# Patient Record
Sex: Female | Born: 1995 | Race: Black or African American | Hispanic: No | Marital: Single | Smoking: Never smoker
Health system: Southern US, Community
[De-identification: ages and names within clinical notes are randomized; demographics above are authoritative.]

## PROBLEM LIST (undated history)

## (undated) DIAGNOSIS — Z7289 Other problems related to lifestyle: Secondary | ICD-10-CM

## (undated) DIAGNOSIS — D573 Sickle-cell trait: Secondary | ICD-10-CM

## (undated) DIAGNOSIS — F32A Depression, unspecified: Secondary | ICD-10-CM

## (undated) DIAGNOSIS — F329 Major depressive disorder, single episode, unspecified: Secondary | ICD-10-CM

## (undated) DIAGNOSIS — T7840XA Allergy, unspecified, initial encounter: Secondary | ICD-10-CM

## (undated) HISTORY — DX: Allergy, unspecified, initial encounter: T78.40XA

## (undated) HISTORY — DX: Sickle-cell trait: D57.3

---
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
## 2016-02-04 ENCOUNTER — Encounter: Payer: Self-pay | Admitting: Family Medicine

## 2016-02-04 ENCOUNTER — Ambulatory Visit (INDEPENDENT_AMBULATORY_CARE_PROVIDER_SITE_OTHER): Payer: Medicaid Other | Admitting: Family Medicine

## 2016-02-04 ENCOUNTER — Ambulatory Visit: Payer: Self-pay | Admitting: Family Medicine

## 2016-02-04 ENCOUNTER — Emergency Department (HOSPITAL_COMMUNITY)
Admission: EM | Admit: 2016-02-04 | Discharge: 2016-02-04 | Disposition: A | Discharge status: Home / Self Care | Payer: Medicaid Other | Attending: Emergency Medicine | Admitting: Emergency Medicine

## 2016-02-04 ENCOUNTER — Encounter (HOSPITAL_COMMUNITY): Payer: Self-pay | Admitting: *Deleted

## 2016-02-04 VITALS — BP 102/82 | HR 57 | Temp 98.3°F | Wt 170.0 lb

## 2016-02-04 DIAGNOSIS — F329 Major depressive disorder, single episode, unspecified: Secondary | ICD-10-CM | POA: Diagnosis not present

## 2016-02-04 DIAGNOSIS — Y990 Civilian activity done for income or pay: Secondary | ICD-10-CM | POA: Insufficient documentation

## 2016-02-04 DIAGNOSIS — Z7722 Contact with and (suspected) exposure to environmental tobacco smoke (acute) (chronic): Secondary | ICD-10-CM | POA: Insufficient documentation

## 2016-02-04 DIAGNOSIS — S0990XA Unspecified injury of head, initial encounter: Secondary | ICD-10-CM | POA: Diagnosis not present

## 2016-02-04 DIAGNOSIS — Y939 Activity, unspecified: Secondary | ICD-10-CM | POA: Diagnosis not present

## 2016-02-04 DIAGNOSIS — S060X0A Concussion without loss of consciousness, initial encounter: Secondary | ICD-10-CM | POA: Diagnosis not present

## 2016-02-04 DIAGNOSIS — Y929 Unspecified place or not applicable: Secondary | ICD-10-CM | POA: Diagnosis not present

## 2016-02-04 DIAGNOSIS — W228XXA Striking against or struck by other objects, initial encounter: Secondary | ICD-10-CM | POA: Insufficient documentation

## 2016-02-04 LAB — POCT URINE PREGNANCY: Preg Test, Ur: NEGATIVE

## 2016-02-04 NOTE — ED Notes (Signed)
Brought patient back to room; patient getting undressed and in gown at this time

## 2016-02-04 NOTE — Progress Notes (Signed)
Subjective: CC: fall at work HPI: Patient is a 20 y.o. female  presenting to clinic today for evaluation after a fall at work.  On Sunday evening after 6pm at work she fell on her head and left hip after slipping on pole. She hit the back side of her head on the tile wood. No LOC. Mild dizziness afterwards. She noted afterwards she had increased confusion. She notes she had difficulty with short term memory right afterwards. She had a headache that gradually came on on Sunday.  She was off work on Monday- she had a severe headache which kept her out of work on Tuesday.   She went back to work on Wednesday and had difficulty with focusing. She's gotten better at focusing (not back to baseline). She still has forgetfulness.   Her headache is in the front region, in the temples, and left parietal region. It feels like pressure and a really bad migraine or sinus infection. The headache is about the same as Monday when she woke up, 7/10. No blurred vision, aura, photophobia, sensitivity to sound. Looking at her phone makes the headache worse. No N/V.    She doesn't get headaches often. Not on blood thinners.   She notes her L hip is good, no bruising or pain.   Social History:  Non-smoker   ROS: All other systems reviewed and are negative.  Past Medical History Patient Active Problem List   Diagnosis Date Noted  . Head trauma 02/04/2016  . Seasonal allergies 10/11/2014  . Contraception management 10/11/2014  . Major depression, recurrent (HCC) 04/12/2014  . Mood disorder (HCC) 04/11/2014  . HYPERHIDROSIS 03/05/2009  . FRACTURE, ANKLE, RIGHT 03/05/2009  . Dermatophytosis of scalp and beard 03/11/2007    Medications- reviewed and updated Current Outpatient Prescriptions  Medication Sig Dispense Refill  . cetirizine (ZYRTEC) 10 MG tablet Take 1 tablet (10 mg total) by mouth daily. 30 tablet 11  . fluticasone (FLONASE) 50 MCG/ACT nasal spray Place 2 sprays into both nostrils daily.  16 g 6  . hydrOXYzine (ATARAX/VISTARIL) 25 MG tablet Take 1 tablet (25 mg total) by mouth 3 (three) times daily as needed for anxiety (anxiety). 30 tablet 0   No current facility-administered medications for this visit.    Objective: Office vital signs reviewed. BP 102/82 mmHg  Pulse 57  Temp(Src) 98.3 F (36.8 C) (Oral)  Wt 170 lb (77.111 kg)  LMP 01/23/2016   Physical Examination:  General: Awake, alert, well- nourished, NAD. No racoon eyes, no battle sign.  ENMT:  TMs intact, normal light reflex, no erythema, no bulging. No hemotympanum.  Nasal turbinates moist. MMM, Oropharynx clear without erythema or tonsillar exudate/hypertrophy.  Eyes: Conjunctiva non-injected. PERRL. Norma fundoscopic exam.  Cardio: RRR, no m/r/g noted.  Pulm: No increased WOB.  CTAB, without wheezes, rhonchi or crackles noted.  MSK: No tenderness over the left hip. No bruising. Tenderness over the parietal region on the left.  Neuro: A&O x4. Speech clear. EOMI, Uvula and tongue midline. Facial movements symmetric. 5/5 strength in the upper extremities and lower extremities bilaterally. Sensation intact bilaterally. Normal DTRs.   Assessment/Plan: Head trauma The patient is presenting after head trauma on Sunday. Since that time, she has had headache that has been stable, confusion, and intermittent amnesia.  Given these findings, her [place] head trauma score states that she should have a head CT to rule out intracranial process.  Fortunately, there are no neurologic deficits noted on exam. This is most likely concussive  syndrome. - Attempted to get a stat CT, however the earliest that this could be done was on Wednesday. - Advised the patient to go to the emergency room stating that she could get a CT performed sooner. - I provided the patient a note to stay out of work over the weekend. I advised her to follow-up on Monday to assess how her symptoms are doing. I discussed that we typically like complete  brain rest until the headache and other symptoms have completely resolved       Orders Placed This Encounter  Procedures  . CT HEAD WO CONTRAST    Standing Status: Future     Number of Occurrences:      Standing Expiration Date: 05/06/2017    Order Specific Question:  Reason for Exam (SYMPTOM  OR DIAGNOSIS REQUIRED)    Answer:  Head trauma Sunday, headache, anterograde amnesia    Order Specific Question:  Is the patient pregnant?    Answer:  No    Order Specific Question:  Preferred imaging location?    Answer:  [facility]  . POCT urine pregnancy    No orders of the defined types were placed in this encounter.    Joanna Puffrystal S. Dorsey PGY-3, [facility]

## 2016-02-04 NOTE — Assessment & Plan Note (Addendum)
The patient is presenting after head trauma on Sunday. Since that time, she has had headache that has been stable, confusion, and intermittent amnesia.  Given these findings, her [place] head trauma score states that she should have a head CT to rule out intracranial process.  Fortunately, there are no neurologic deficits noted on exam. This is most likely concussive syndrome. - Attempted to get a stat CT, however the earliest that this could be done was on Wednesday. - Advised the patient to go to the emergency room stating that she could get a CT performed sooner. - I provided the patient a note to stay out of work over the weekend. I advised her to follow-up on Monday to assess how her symptoms are doing. I discussed that we typically like complete brain rest until the headache and other symptoms have completely resolved

## 2016-02-04 NOTE — Patient Instructions (Signed)
I have ordered a head CT to evaluate to make sure that this is simply a concussion and not something more severe. You should continue to have cognitive rest, meaning that you should not work over the weekend, you should avoid watching TV or using a phone or tablet. INRs 1 from my team will call you when we have results of your head CT.  Concussion, Adult A concussion, or closed-head injury, is a brain injury caused by a direct blow to the head or by a quick and sudden movement (jolt) of the head or neck. Concussions are usually not life-threatening. Even so, the effects of a concussion can be serious. If you have had a concussion before, you are more likely to experience concussion-like symptoms after a direct blow to the head.  CAUSES  Direct blow to the head, such as from running into another player during a soccer game, being hit in a fight, or hitting your head on a hard surface.  A jolt of the head or neck that causes the brain to move back and forth inside the skull, such as in a car crash. SIGNS AND SYMPTOMS The signs of a concussion can be hard to notice. Early on, they may be missed by you, family members, and health care providers. You may look fine but act or feel differently. Symptoms are usually temporary, but they may last for days, weeks, or even longer. Some symptoms may appear right away while others may not show up for hours or days. Every head injury is different. Symptoms include:  Mild to moderate headaches that will not go away.  A feeling of pressure inside your head.  Having more trouble than usual:  Learning or remembering things you have heard.  Answering questions.  Paying attention or concentrating.  Organizing daily tasks.  Making decisions and solving problems.  Slowness in thinking, acting or reacting, speaking, or reading.  Getting lost or being easily confused.  Feeling tired all the time or lacking energy (fatigued).  Feeling drowsy.  Sleep  disturbances.  Sleeping more than usual.  Sleeping less than usual.  Trouble falling asleep.  Trouble sleeping (insomnia).  Loss of balance or feeling lightheaded or dizzy.  Nausea or vomiting.  Numbness or tingling.  Increased sensitivity to:  Sounds.  Lights.  Distractions.  Vision problems or eyes that tire easily.  Diminished sense of taste or smell.  Ringing in the ears.  Mood changes such as feeling sad or anxious.  Becoming easily irritated or angry for little or no reason.  Lack of motivation.  Seeing or hearing things other people do not see or hear (hallucinations). DIAGNOSIS Your health care provider can usually diagnose a concussion based on a description of your injury and symptoms. He or she will ask whether you passed out (lost consciousness) and whether you are having trouble remembering events that happened right before and during your injury. Your evaluation might include:  A brain scan to look for signs of injury to the brain. Even if the test shows no injury, you may still have a concussion.  Blood tests to be sure other problems are not present. TREATMENT  Concussions are usually treated in an emergency department, in urgent care, or at a clinic. You may need to stay in the hospital overnight for further treatment.  Tell your health care provider if you are taking any medicines, including prescription medicines, over-the-counter medicines, and natural remedies. Some medicines, such as blood thinners (anticoagulants) and aspirin, may increase the  chance of complications. Also tell your health care provider whether you have had alcohol or are taking illegal drugs. This information may affect treatment.  Your health care provider will send you home with important instructions to follow.  How fast you will recover from a concussion depends on many factors. These factors include how severe your concussion is, what part of your brain was injured,  your age, and how healthy you were before the concussion.  Most people with mild injuries recover fully. Recovery can take time. In general, recovery is slower in older persons. Also, persons who have had a concussion in the past or have other medical problems may find that it takes longer to recover from their current injury. HOME CARE INSTRUCTIONS General Instructions  Carefully follow the directions your health care provider gave you.  Only take over-the-counter or prescription medicines for pain, discomfort, or fever as directed by your health care provider.  Take only those medicines that your health care provider has approved.  Do not drink alcohol until your health care provider says you are well enough to do so. Alcohol and certain other drugs may slow your recovery and can put you at risk of further injury.  If it is harder than usual to remember things, write them down.  If you are easily distracted, try to do one thing at a time. For example, do not try to watch TV while fixing dinner.  Talk with family members or close friends when making important decisions.  Keep all follow-up appointments. Repeated evaluation of your symptoms is recommended for your recovery.  Watch your symptoms and tell others to do the same. Complications sometimes occur after a concussion. Older adults with a brain injury may have a higher risk of serious complications, such as a blood clot on the brain.  Tell your teachers, school nurse, school counselor, coach, athletic trainer, or work Production designer, theatre/television/film about your injury, symptoms, and restrictions. Tell them about what you can or cannot do. They should watch for:  Increased problems with attention or concentration.  Increased difficulty remembering or learning new information.  Increased time needed to complete tasks or assignments.  Increased irritability or decreased ability to cope with stress.  Increased symptoms.  Rest. Rest helps the brain to  heal. Make sure you:  Get plenty of sleep at night. Avoid staying up late at night.  Keep the same bedtime hours on weekends and weekdays.  Rest during the day. Take daytime naps or rest breaks when you feel tired.  Limit activities that require a lot of thought or concentration. These include:  Doing homework or job-related work.  Watching TV.  Working on the computer.  Avoid any situation where there is potential for another head injury (football, hockey, soccer, basketball, martial arts, downhill snow sports and horseback riding). Your condition will get worse every time you experience a concussion. You should avoid these activities until you are evaluated by the appropriate follow-up health care providers. Returning To Your Regular Activities You will need to return to your normal activities slowly, not all at once. You must give your body and brain enough time for recovery.  Do not return to sports or other athletic activities until your health care provider tells you it is safe to do so.  Ask your health care provider when you can drive, ride a bicycle, or operate heavy machinery. Your ability to react may be slower after a brain injury. Never do these activities if you are dizzy.  Ask  your health care provider about when you can return to work or school. Preventing Another Concussion It is very important to avoid another brain injury, especially before you have recovered. In rare cases, another injury can lead to permanent brain damage, brain swelling, or death. The risk of this is greatest during the first 7-10 days after a head injury. Avoid injuries by:  Wearing a seat belt when riding in a car.  Drinking alcohol only in moderation.  Wearing a helmet when biking, skiing, skateboarding, skating, or doing similar activities.  Avoiding activities that could lead to a second concussion, such as contact or recreational sports, until your health care provider says it is  okay.  Taking safety measures in your home.  Remove clutter and tripping hazards from floors and stairways.  Use grab bars in bathrooms and handrails by stairs.  Place non-slip mats on floors and in bathtubs.  Improve lighting in dim areas. SEEK MEDICAL CARE IF:  You have increased problems paying attention or concentrating.  You have increased difficulty remembering or learning new information.  You need more time to complete tasks or assignments than before.  You have increased irritability or decreased ability to cope with stress.  You have more symptoms than before. Seek medical care if you have any of the following symptoms for more than 2 weeks after your injury:  Lasting (chronic) headaches.  Dizziness or balance problems.  Nausea.  Vision problems.  Increased sensitivity to noise or light.  Depression or mood swings.  Anxiety or irritability.  Memory problems.  Difficulty concentrating or paying attention.  Sleep problems.  Feeling tired all the time. SEEK IMMEDIATE MEDICAL CARE IF:  You have severe or worsening headaches. These may be a sign of a blood clot in the brain.  You have weakness (even if only in one hand, leg, or part of the face).  You have numbness.  You have decreased coordination.  You vomit repeatedly.  You have increased sleepiness.  One pupil is larger than the other.  You have convulsions.  You have slurred speech.  You have increased confusion. This may be a sign of a blood clot in the brain.  You have increased restlessness, agitation, or irritability.  You are unable to recognize people or places.  You have neck pain.  It is difficult to wake you up.  You have unusual behavior changes.  You lose consciousness. MAKE SURE YOU:  Understand these instructions.  Will watch your condition.  Will get help right away if you are not doing well or get worse.   This information is not intended to replace advice  given to you by your health care provider. Make sure you discuss any questions you have with your health care provider.   Document Released: 09/23/2003 Document Revised: 07/24/2014 Document Reviewed: 01/23/2013 Elsevier Interactive Patient Education [employer]

## 2016-02-04 NOTE — ED Notes (Addendum)
Pt reports falling on Sunday at work and hit back of head, no loc. Having constant headache and memory loss since. Denies n/v. [facility] and sent here for ct scan.

## 2016-02-04 NOTE — Discharge Instructions (Signed)
Return to the ER immediately if your headache worsens, he developed vomiting, weakness, numbness, or any other new or concerning symptoms. Follow-up with your primary care doctor as scheduled.

## 2016-02-04 NOTE — ED Provider Notes (Addendum)
CSN: [id number]     Arrival date & time 02/04/16  1048 History   First MD Initiated Contact with Patient 02/04/16 1148     Chief Complaint  Patient presents with  . Fall  . Head Injury     (Consider location/radiation/quality/duration/timing/severity/associated sxs/prior Treatment) HPI  20 year old female presents with a headache since slipping and falling 5 days ago. She saw her primary care physician today and was sent to the ER for a stat head CT. Patient states that she slipped and fell on it the back of her head 5 days ago and did not lose consciousness. Later that evening developed a headache. Since then has had a constant, 7/10 headache. It is frontal and bitemporal. Has also been having memory issues, including forgetting things that coworkers talked about. Headache is not dramatically worsened. No nausea or vomiting. Some mild photophobia and some pain when looking closely at her phone. No weakness or numbness. Has not taken any medicines for the pain. No neck pain or stiffness.  Past Medical History  Diagnosis Date  . Allergy   . Sickle cell trait (HCC)   . Depression   . Self-mutilation    Past Surgical History  Procedure Laterality Date  . Ankle fracture surgery  2010    Bimalleolar fx repair, ORIF, fasciotiomy for compartment syndrome   Family History  Problem Relation Age of Onset  . Birth defects Brother     coarctation aorta  . Heart disease Brother   . Diabetes Maternal Grandmother   . Hypertension Maternal Grandmother   . Bipolar disorder Maternal Grandmother   . Cancer Maternal Grandmother     breast  . Hypertension Maternal Grandfather   . Kidney disease Maternal Grandfather   . Heart disease Maternal Grandfather    Social History  Substance Use Topics  . Smoking status: Passive Smoke Exposure - Never Smoker  . Smokeless tobacco: Never Used     Comment: mom smokes around her  . Alcohol Use: No   OB History    No data available     Review of  Systems  Eyes: Positive for photophobia. Negative for visual disturbance.  Gastrointestinal: Negative for nausea and vomiting.  Musculoskeletal: Negative for neck pain.  Neurological: Positive for dizziness and headaches. Negative for syncope, weakness and numbness.  All other systems reviewed and are negative.     Allergies  Apple; Carrot; Strawberry extract; Tomato; Banana; and Bee venom  Home Medications   Prior to Admission medications   Medication Sig Start Date End Date Taking? Authorizing Provider  cetirizine (ZYRTEC) 10 MG tablet Take 1 tablet (10 mg total) by mouth daily. 10/08/14   Latrelle DodrillBrittany J McIntyre, MD  fluticasone (FLONASE) 50 MCG/ACT nasal spray Place 2 sprays into both nostrils daily. 10/08/14   Latrelle DodrillBrittany J McIntyre, MD  hydrOXYzine (ATARAX/VISTARIL) 25 MG tablet Take 1 tablet (25 mg total) by mouth 3 (three) times daily as needed for anxiety (anxiety). 04/14/14   Thermon LeylandLaura A Davis, NP   BP 112/73 mmHg  Pulse 61  Temp(Src) 98.2 F (36.8 C) (Oral)  Resp 17  Ht 5\' 3"  (1.6 m)  Wt 170 lb (77.111 kg)  BMI 30.12 kg/m2  SpO2 99%  LMP 01/23/2016 Physical Exam  Constitutional: She is oriented to person, place, and time. She appears well-developed and well-nourished.  HENT:  Head: Normocephalic and atraumatic.  Right Ear: External ear normal.  Left Ear: External ear normal.  Nose: Nose normal.  No obvious trauma or swelling  Eyes: EOM are  normal. Pupils are equal, round, and reactive to light. Right eye exhibits no discharge. Left eye exhibits no discharge.  Neck: Normal range of motion. Neck supple.  Cardiovascular: Normal rate, regular rhythm and normal heart sounds.   Pulmonary/Chest: Effort normal and breath sounds normal.  Abdominal: Soft. There is no tenderness.  Neurological: She is alert and oriented to person, place, and time.  CN 3-12 grossly intact. 5/5 strength in all 4 extremities. Grossly normal sensation. Normal finger to nose  Skin: Skin is warm and dry.   Nursing note and vitals reviewed.   ED Course  Procedures (including critical care time) Labs Review Labs Reviewed - No data to display  Imaging Review No results found. I have personally reviewed and evaluated these images and lab results as part of my medical decision-making.   EKG Interpretation None      MDM   Final diagnoses:  Concussion, without loss of consciousness, initial encounter    Patient overall appears quite well. My suspicion for significant or any intracranial hemorrhage or significant abnormality is quite low. I think her symptoms are classic for a post concussive syndrome. She was sent here for CT scan. I discussed risks/benefits of CT including radiation as well as possible missed adverse event such as bleeding or skull fracture. When discussing risks and benefits, patient has decided she does not want the CT scan. I think this is reasonable, especially because the initial injury occurred over 5 days ago. She will follow-up with her PCP as scheduled in 3 days. I discussed strict return precautions.    Pricilla Loveless, MD 02/04/16 1214  Pricilla Loveless, MD 02/04/16 1215

## 2016-02-09 ENCOUNTER — Other Ambulatory Visit (HOSPITAL_COMMUNITY): Payer: Self-pay

## 2016-04-09 ENCOUNTER — Encounter (HOSPITAL_COMMUNITY): Payer: Self-pay | Admitting: *Deleted

## 2016-04-09 ENCOUNTER — Emergency Department (HOSPITAL_COMMUNITY)
Admission: EM | Admit: 2016-04-09 | Discharge: 2016-04-09 | Disposition: A | Discharge status: Home / Self Care | Payer: Medicaid Other | Attending: Emergency Medicine | Admitting: Emergency Medicine

## 2016-04-09 DIAGNOSIS — N898 Other specified noninflammatory disorders of vagina: Secondary | ICD-10-CM | POA: Diagnosis present

## 2016-04-09 DIAGNOSIS — B373 Candidiasis of vulva and vagina: Secondary | ICD-10-CM | POA: Diagnosis not present

## 2016-04-09 DIAGNOSIS — Z202 Contact with and (suspected) exposure to infections with a predominantly sexual mode of transmission: Secondary | ICD-10-CM | POA: Diagnosis not present

## 2016-04-09 DIAGNOSIS — B3731 Acute candidiasis of vulva and vagina: Secondary | ICD-10-CM

## 2016-04-09 DIAGNOSIS — Z7722 Contact with and (suspected) exposure to environmental tobacco smoke (acute) (chronic): Secondary | ICD-10-CM | POA: Insufficient documentation

## 2016-04-09 LAB — POC URINE PREG, ED: PREG TEST UR: NEGATIVE

## 2016-04-09 LAB — URINALYSIS, ROUTINE W REFLEX MICROSCOPIC
Bilirubin Urine: NEGATIVE
Glucose, UA: NEGATIVE mg/dL
Hgb urine dipstick: NEGATIVE
KETONES UR: NEGATIVE mg/dL
LEUKOCYTES UA: NEGATIVE
NITRITE: NEGATIVE
PROTEIN: NEGATIVE mg/dL
Specific Gravity, Urine: 1.01 (ref 1.005–1.030)
pH: 7 (ref 5.0–8.0)

## 2016-04-09 LAB — WET PREP, GENITAL
CLUE CELLS WET PREP: NONE SEEN
Sperm: NONE SEEN
TRICH WET PREP: NONE SEEN

## 2016-04-09 MED ORDER — AZITHROMYCIN 250 MG PO TABS
1000.0000 mg | ORAL_TABLET | Freq: Once | ORAL | Status: AC
  Administered 2016-04-09: 1000 mg via ORAL
  Filled 2016-04-09: qty 4

## 2016-04-09 MED ORDER — METRONIDAZOLE 500 MG PO TABS: 500.0000 mg | ORAL_TABLET | Freq: Three times a day (TID) | ORAL | 0 refills | Status: DC

## 2016-04-09 MED ORDER — METRONIDAZOLE 500 MG PO TABS
500.0000 mg | ORAL_TABLET | Freq: Once | ORAL | Status: AC
  Administered 2016-04-09: 500 mg via ORAL
  Filled 2016-04-09: qty 1

## 2016-04-09 MED ORDER — CEFTRIAXONE SODIUM 250 MG IJ SOLR
250.0000 mg | Freq: Once | INTRAMUSCULAR | Status: AC
  Administered 2016-04-09: 250 mg via INTRAMUSCULAR
  Filled 2016-04-09: qty 250

## 2016-04-09 NOTE — Discharge Instructions (Signed)
Your weight prep of vaginal swab shows that you do have a vaginal yeast infection.  You've also been treated for gonorrhea and chlamydia as a preventive cultures have been taken, and you will be notified if positive, you have been given instructions on safe sex.  Please try to practice this in the future

## 2016-04-09 NOTE — ED Provider Notes (Signed)
[facility]-EMERGENCY DEPT Provider Note   CSN: [id number] Arrival date & time: 04/09/16  1841     History   Chief Complaint Chief Complaint  Patient presents with  . Vaginal Discharge  . SEXUALLY TRANSMITTED DISEASE    HPI Jo [name] is a 20 y.o. female.  This is 20 year old female who is sexually active without protection reports that she's had vaginal discharge since Wednesday.  She tried using Monistat without any relief.  She felt that actually made the irritation worse.  She does have a history of one yeast infection in the past.  No history of STD or pregnancy      Past Medical History:  Diagnosis Date  . Allergy   . Depression   . Self-mutilation   . Sickle cell trait [facility])     Patient Active Problem List   Diagnosis Date Noted  . Head trauma 02/04/2016  . Seasonal allergies 10/11/2014  . Contraception management 10/11/2014  . Major depression, recurrent (HCC) 04/12/2014  . Mood disorder (HCC) 04/11/2014  . HYPERHIDROSIS 03/05/2009  . FRACTURE, ANKLE, RIGHT 03/05/2009  . Dermatophytosis of scalp and beard 03/11/2007    Past Surgical History:  Procedure Laterality Date  . ANKLE FRACTURE SURGERY  2010   Bimalleolar fx repair, ORIF, fasciotiomy for compartment syndrome    OB History    No data available       Home Medications    Prior to Admission medications   Medication Sig Start Date End Date Taking? Authorizing Provider  cetirizine (ZYRTEC) 10 MG tablet Take 1 tablet (10 mg total) by mouth daily. 10/08/14   Latrelle Dodrill, MD  fluticasone (FLONASE) 50 MCG/ACT nasal spray Place 2 sprays into both nostrils daily. 10/08/14   Latrelle Dodrill, MD  hydrOXYzine (ATARAX/VISTARIL) 25 MG tablet Take 1 tablet (25 mg total) by mouth 3 (three) times daily as needed for anxiety (anxiety). 04/14/14   Thermon Leyland, NP  metroNIDAZOLE (FLAGYL) 500 MG tablet Take 1 tablet (500 mg total) by mouth 3 (three) times daily. 04/09/16   Earley Favor, NP    Family  History Family History  Problem Relation Age of Onset  . Birth defects Brother     coarctation aorta  . Heart disease Brother   . Diabetes Maternal Grandmother   . Hypertension Maternal Grandmother   . Bipolar disorder Maternal Grandmother   . Cancer Maternal Grandmother     breast  . Hypertension Maternal Grandfather   . Kidney disease Maternal Grandfather   . Heart disease Maternal Grandfather     Social History Social History  Substance Use Topics  . Smoking status: Passive Smoke Exposure - Never Smoker  . Smokeless tobacco: Never Used     Comment: mom smokes around her  . Alcohol use No     Allergies   Apple; Carrot [daucus carota]; Strawberry extract; Tomato; Banana; and Bee venom   Review of Systems Review of Systems  Genitourinary: Positive for vaginal discharge and vaginal pain. Negative for dysuria.  All other systems reviewed and are negative.    Physical Exam Updated Vital Signs BP 122/79   Pulse 64   Temp 98.5 F (36.9 C) (Oral)   Resp 16   LMP 03/23/2016   SpO2 100%   Physical Exam  Constitutional: She appears well-developed and well-nourished. No distress.  Eyes: Pupils are equal, round, and reactive to light.  Neck: Normal range of motion.  Cardiovascular: Normal rate.   Pulmonary/Chest: Effort normal.  Abdominal: Soft. Bowel sounds  are normal. She exhibits no distension. There is no tenderness.  Genitourinary: Pelvic exam was performed with patient supine. Cervix exhibits discharge. Right adnexum displays no tenderness. Left adnexum displays no tenderness. Vaginal discharge found.  Genitourinary Comments: Strawberry cervix  Nursing note and vitals reviewed.    ED Treatments / Results  Labs (all labs ordered are listed, but only abnormal results are displayed) Labs Reviewed  WET PREP, GENITAL - Abnormal; Notable for the following:       Result Value   Yeast Wet Prep HPF POC PRESENT (*)    WBC, Wet Prep HPF POC MANY (*)    All other  components within normal limits  URINALYSIS, ROUTINE W REFLEX MICROSCOPIC (NOT AT [facility]) - Abnormal; Notable for the following:    APPearance CLOUDY (*)    All other components within normal limits  HIV ANTIBODY (ROUTINE TESTING)  RPR  POC URINE PREG, ED  GC/CHLAMYDIA PROBE AMP ([place]) NOT AT [facility]    EKG  EKG Interpretation None       Radiology No results found.  Procedures Procedures (including critical care time)  Medications Ordered in ED Medications  metroNIDAZOLE (FLAGYL) tablet 500 mg (not administered)  azithromycin (ZITHROMAX) tablet 1,000 mg (not administered)  cefTRIAXone (ROCEPHIN) injection 250 mg (not administered)     Initial Impression / Assessment and Plan / ED Course  I have reviewed the triage vital signs and the nursing notes.  Pertinent labs & imaging results that were available during my care of the patient were reviewed by me and considered in my medical decision making (see chart for details).  Clinical Course       Final Clinical Impressions(s) / ED Diagnoses   Final diagnoses:  Yeast vaginitis  STD exposure    New Prescriptions New Prescriptions   METRONIDAZOLE (FLAGYL) 500 MG TABLET    Take 1 tablet (500 mg total) by mouth 3 (three) times daily.     Earley FavorGail Alayzha An, NP 04/09/16 2121    Lorre NickAnthony Allen, MD 04/17/16 [phone]

## 2016-04-09 NOTE — ED Triage Notes (Signed)
Pt reports vaginal discharge and itching, no relief with monistat and wants to be checked for std. Denies urinary symptoms.

## 2016-04-10 LAB — GC/CHLAMYDIA PROBE AMP (~~LOC~~) NOT AT ARMC
Chlamydia: NEGATIVE
Neisseria Gonorrhea: NEGATIVE

## 2016-04-10 LAB — HIV ANTIBODY (ROUTINE TESTING W REFLEX): HIV Screen 4th Generation wRfx: NONREACTIVE

## 2016-04-10 LAB — RPR: RPR Ser Ql: NONREACTIVE

## 2016-07-05 ENCOUNTER — Ambulatory Visit: Payer: Medicaid Other | Admitting: Student

## 2016-09-27 ENCOUNTER — Ambulatory Visit: Payer: Medicaid Other | Admitting: Student

## 2016-10-04 ENCOUNTER — Ambulatory Visit (INDEPENDENT_AMBULATORY_CARE_PROVIDER_SITE_OTHER): Payer: Medicaid Other | Admitting: Student

## 2016-10-04 ENCOUNTER — Encounter: Payer: Self-pay | Admitting: Student

## 2016-10-04 VITALS — BP 110/80 | HR 71 | Temp 98.2°F | Wt 183.0 lb

## 2016-10-04 DIAGNOSIS — Z3046 Encounter for surveillance of implantable subdermal contraceptive: Secondary | ICD-10-CM

## 2016-10-04 DIAGNOSIS — Z30019 Encounter for initial prescription of contraceptives, unspecified: Secondary | ICD-10-CM

## 2016-10-04 DIAGNOSIS — Z7251 High risk heterosexual behavior: Secondary | ICD-10-CM | POA: Diagnosis not present

## 2016-10-04 DIAGNOSIS — F39 Unspecified mood [affective] disorder: Secondary | ICD-10-CM | POA: Diagnosis not present

## 2016-10-04 LAB — POCT URINE PREGNANCY: Preg Test, Ur: NEGATIVE

## 2016-10-04 NOTE — Progress Notes (Signed)
   Subjective:    Patient ID: Jo [name], female    DOB: 07/11/1996, 20 y.o.   MRN: [id number]   CC: discuss birth control and depression  HPI: 21 y/o F who presents to discuss birth control and depression  Birth control - Patient's last menstrual period was 09/18/2016. - she was last sexually active 3 months ago, she used a condom - she desires contraception to help regulate her hormones.  - she feels that she has low moods surrounding her periods and wishes to start something to help regulate her moods  Depression - she feels her mood is generally good except surrounding her menses when she develops low moods and eats less - she denies SI/HI during that time or otherwise - she has been seeing a counselor  - PHQ9- 9, GAD7- 4  Smoking status reviewed  Review of Systems  Per HPI, else denies recent illness, fever, vaginal irritation, discharge   Objective:  BP 110/80   Pulse 71   Temp 98.2 F (36.8 C) (Oral)   Wt 183 lb (83 kg)   LMP 09/18/2016   SpO2 99%   BMI 32.42 kg/m  Vitals and nursing note reviewed  General: NAD Cardiac: RRR,  Respiratory: CTAB, normal effortdd Extremities: no edema or cyanosis. WWP. Skin: warm and dry, no rashes noted Neuro: alert and oriented, no focal deficits   Assessment & Plan:    Contraception management Upreg negative. Nexplanon placed today ( see procedure note) - follow up in 1 month to assess tolerance - RPR, HIV collected today  Mood disorder PHQ9 9, GAD7- 4, no red flags on interview no SI/HI - will follow with [facility]     Viktorya Arguijo A. Kennon RoundsHaney MD, MS Family Medicine Resident PGY-3 Pager [phone]

## 2016-10-04 NOTE — Assessment & Plan Note (Signed)
PHQ9 9, GAD7- 4, no red flags on interview no SI/HI - [facility]

## 2016-10-04 NOTE — Progress Notes (Signed)
PROCEDURE NOTE: Nexplanon insertion Patient given informed consent, signed copy in the chart.  Appropriate time out taken  Pregnancy test was negative.  The patient's  left arm was prepped and draped in the usual sterile fashion.. The ruler used to measure and mark the insertion area 6 cm from medial epicondyle of the elbow. Local anaesthesia obtained using 1.5 cc of 1% lidocaine with epinephrine. Nexplanon was inserted per manufacturer's directions. Less than 1 cc blood loss. The insertion site covered with antibiotic ointment and a pressure bandage to minimize bruising. There were no complications and the patient tolerated the procedure well.  Device information was given in handout form. Patient is informed the removal date will be in three years and package insert card filled out and given to her.

## 2016-10-04 NOTE — Patient Instructions (Signed)
Follow up in one month with PCP Follow up with behavioral health If you have questions or concerns, call the office at [phone]

## 2016-10-04 NOTE — Assessment & Plan Note (Addendum)
Upreg negative. Nexplanon placed today ( see procedure note) - follow up in 1 month to assess tolerance - RPR, HIV collected today

## 2016-10-05 ENCOUNTER — Telehealth: Payer: Self-pay | Admitting: Student

## 2016-10-05 LAB — RPR: RPR Ser Ql: NONREACTIVE

## 2016-10-05 LAB — HIV ANTIBODY (ROUTINE TESTING W REFLEX): HIV Screen 4th Generation wRfx: NONREACTIVE

## 2016-10-05 MED ORDER — ETONOGESTREL 68 MG ~~LOC~~ IMPL
68.0000 mg | DRUG_IMPLANT | Freq: Once | SUBCUTANEOUS | Status: AC
  Administered 2016-10-04: 68 mg via SUBCUTANEOUS

## 2016-10-05 NOTE — Telephone Encounter (Signed)
Please call the patient and inform her that her RPR and HIV were negative

## 2016-10-05 NOTE — Telephone Encounter (Signed)
Reported negative labs to patient.

## 2016-10-05 NOTE — Addendum Note (Signed)
Addended by: Jone BasemanFLEEGER, Tasman Zapata D on: 10/05/2016 10:52 AM   Modules accepted: Orders

## 2016-11-06 ENCOUNTER — Ambulatory Visit: Payer: Self-pay | Admitting: Student

## 2019-04-03 ENCOUNTER — Other Ambulatory Visit (HOSPITAL_COMMUNITY)
Admission: RE | Admit: 2019-04-03 | Discharge: 2019-04-03 | Disposition: A | Discharge status: Home / Self Care | Payer: Medicaid Other | Source: Ambulatory Visit | Attending: Family Medicine | Admitting: Family Medicine

## 2019-04-03 ENCOUNTER — Ambulatory Visit (INDEPENDENT_AMBULATORY_CARE_PROVIDER_SITE_OTHER): Payer: Medicaid Other | Admitting: Family Medicine

## 2019-04-03 ENCOUNTER — Other Ambulatory Visit: Payer: Self-pay

## 2019-04-03 VITALS — BP 124/65 | HR 97 | Temp 98.8°F | Wt 206.0 lb

## 2019-04-03 DIAGNOSIS — Z01419 Encounter for gynecological examination (general) (routine) without abnormal findings: Secondary | ICD-10-CM

## 2019-04-03 DIAGNOSIS — Z124 Encounter for screening for malignant neoplasm of cervix: Secondary | ICD-10-CM | POA: Insufficient documentation

## 2019-04-03 NOTE — Assessment & Plan Note (Signed)
Will communicate results with patient via my chart or via phone call.  If normal, patient to follow-up in 3 years for repeat testing.  Patient will return next year to have Nexplanon removed and replaced.

## 2019-04-03 NOTE — Patient Instructions (Addendum)
Dear Jo [name],   It was good to see you! Thank you for taking your time to come in to be seen. Today, we discussed the following:   Pap Smear   You did so great today!! I know its uncomfortable!   We will send you results when they return.   Sign up for MyChart so that we can communicate via the app.   See you next year for the nexplanon.   Also, you will have some light bleeding today. It should stop by tomorrow. Call us if you have any concerns.   Be well,   Genia Hotterachel Kim, M.D   [facility] [phone]  *Sign up for MyChart for instant access to your health profile, labs, orders, upcoming appointments or to contact your provider with questions*  ===================================================================================  Pap Test Why am I having this test? A Pap test, also called a Pap smear, is a screening test to check for signs of:  Cancer of the vagina, cervix, and uterus. The cervix is the lower part of the uterus that opens into the vagina.  Infection.  Changes that may be a sign that cancer is developing (precancerous changes). Women need this test on a regular basis. In general, you should have a Pap test every 3 years until you reach menopause or age 23. Women aged 30-60 may choose to have their Pap test done at the same time as an HPV (human papillomavirus) test every 5 years (instead of every 3 years). Your health care provider may recommend having Pap tests more or less often depending on your medical conditions and past Pap test results. What kind of sample is taken?  Your health care provider will collect a sample of cells from the surface of your cervix. This will be done using a small cotton swab, plastic spatula, or brush. This sample is often collected during a pelvic exam, when you are lying on your back on an exam table with feet in footrests (stirrups). In some cases, fluids (secretions) from the cervix or vagina may also be  collected. How do I prepare for this test?  Be aware of where you are in your menstrual cycle. If you are menstruating on the day of the test, you may be asked to reschedule.  You may need to reschedule if you have a known vaginal infection on the day of the test.  Follow instructions from your health care provider about: ? Changing or stopping your regular medicines. Some medicines can cause abnormal test results, such as digitalis and tetracycline. ? Avoiding douching or taking a bath the day before or the day of the test. Tell a health care provider about:  Any allergies you have.  All medicines you are taking, including vitamins, herbs, eye drops, creams, and over-the-counter medicines.  Any blood disorders you have.  Any surgeries you have had.  Any medical conditions you have.  Whether you are pregnant or may be pregnant. How are the results reported? Your test results will be reported as either abnormal or normal. A false-positive result can occur. A false positive is incorrect because it means that a condition is present when it is not. A false-negative result can occur. A false negative is incorrect because it means that a condition is not present when it is. What do the results mean? A normal test result means that you do not have signs of cancer of the vagina, cervix, or uterus. An abnormal result may mean that you have:  Cancer. A Pap test by itself is not enough to diagnose cancer. You will have more tests done in this case.  Precancerous changes in your vagina, cervix, or uterus.  Inflammation of the cervix.  An STD (sexually transmitted disease).  A fungal infection.  A parasite infection. Talk with your health care provider about what your results mean. Questions to ask your health care provider Ask your health care provider, or the department that is doing the test:  When will my results be ready?  How will I get my results?  What are my treatment  options?  What other tests do I need?  What are my next steps? Summary  In general, women should have a Pap test every 3 years until they reach menopause or age 3.  Your health care provider will collect a sample of cells from the surface of your cervix. This will be done using a small cotton swab, plastic spatula, or brush.  In some cases, fluids (secretions) from the cervix or vagina may also be collected. This information is not intended to replace advice given to you by your health care provider. Make sure you discuss any questions you have with your health care provider. Document Released: 09/23/2002 Document Revised: 03/12/2017 Document Reviewed: 03/12/2017 Elsevier Patient Education  2020 [employer].

## 2019-04-03 NOTE — Progress Notes (Signed)
      Subjective  CC: Health Maintenance  [id number] U Jo [name] is a 23 y.o. female who presents today with the following problems: Pap Smear  Patient has never had previous Pap smear.  Pertinent PMFSHx: Nexplanon for birth control.  Maternal grandmother with history of breast cancer.  No other vaginal, vulvar, cervical, ovarian cancer noted. ROS: Pertinent ROS included in HPI.    Objective  Physical Exam:  BP 124/65   Pulse 97   Temp 98.8 F (37.1 C) (Oral)   Wt 206 lb (93.4 kg)   LMP 03/20/2019   SpO2 97%   BMI 36.49 kg/m  GU: External vulva and vagina nonerythematous, without any obvious lesions or rash.  Spinnbarkeit discharge appreciated. Normal ruggae of vaginal walls.  Cervix is non erythematous and non-friable and sits to patient's left and anteroverted.  There is no cervical motion tenderness, masses or gross abnormalities appreciated during bimanual exam.      Assessment & Plan    Problem List Items Addressed This Visit      Unprioritized   Screening for cervical cancer - Primary    Will communicate results with patient via my chart or via phone call.  If normal, patient to follow-up in 3 years for repeat testing.  Patient will return next year to have Nexplanon removed and replaced.      Relevant Orders   Cytology - PAP([facility])     Wilber Oliphant, M.D.  PGY-2  Family Medicine  [phone] 04/03/2019 11:50 AM

## 2019-04-08 LAB — CYTOLOGY - PAP
Chlamydia: POSITIVE — AB
Diagnosis: NEGATIVE
Molecular Disclaimer: NEGATIVE
Molecular Disclaimer: NEGATIVE
Neisseria Gonorrhea: NEGATIVE
Trichomonas: NEGATIVE

## 2019-05-08 ENCOUNTER — Other Ambulatory Visit: Payer: Self-pay

## 2019-05-08 ENCOUNTER — Ambulatory Visit (INDEPENDENT_AMBULATORY_CARE_PROVIDER_SITE_OTHER): Payer: Medicaid Other | Admitting: *Deleted

## 2019-05-08 ENCOUNTER — Telehealth: Payer: Self-pay | Admitting: Family Medicine

## 2019-05-08 DIAGNOSIS — A749 Chlamydial infection, unspecified: Secondary | ICD-10-CM | POA: Diagnosis present

## 2019-05-08 MED ORDER — AZITHROMYCIN 500 MG PO TABS
1000.0000 mg | ORAL_TABLET | Freq: Once | ORAL | Status: AC
  Administered 2019-05-08: 17:00:00 1000 mg via ORAL

## 2019-05-08 NOTE — Telephone Encounter (Signed)
Notified of positive chlamydia She will come for treatment tomorrow (nurse visit) at 10:00. She "knows who the likely exposure is from and is advised of need for allof her current sexual partners to be treated. Will also give her handout when she comes tomorrow NURSE VISIT: please treat with one gram po azithromycin  Jo [name]

## 2019-05-08 NOTE — Progress Notes (Signed)
Patient in nurse clinic today for STD treatment of Chlamydia.    Patient advised to abstain from sex for 7-10 days after treatment or when partner has been tested/treated.    Azithromycin 1 GM PO x 1 given per Dr. Verlon Au orders.  Patient observed 15 minutes in office.  No reaction noted.   Patient to follow up in 2-3 months for re-screening.    Provided condoms and advised to use with all sexual activity. Patient verbalized understanding.   STD report form fax completed and faxed to [facility] Department at [phone] (STD department).     Christen Bame, CMA

## 2019-05-09 ENCOUNTER — Ambulatory Visit: Payer: Medicaid Other

## 2019-05-11 ENCOUNTER — Telehealth: Payer: Self-pay | Admitting: Family Medicine

## 2019-05-11 NOTE — Telephone Encounter (Signed)
[facility] Telemedicine Visit  Patient consented to have virtual visit. Method of visit: Telephone  Encounter participants: Patient: Jo [name] - located at home Provider: Matilde Haymaker - located at [facility] Others (if applicable): none  Chief Complaint: recent test chlamydia  HPI:  Chlamydia Jo [name] was distressed about her recent results of a chlamydia infection and wanted to discuss them with a healthcare provider.  She recently learned that she was chlamydia positive and that she likely contracted this chlamydia from her significant other.  Her significant [facility] and reported a negative chlamydia test.  Jo [name] wanted to know how she might have a positive chlamydia test if her significant other had a negative chlamydia test.  She reported that she was given azithromycin as treatment for infection.  She wanted to know if anything further was necessary.  She also reports strong abdominal cramping that feels similar to her monthly menstruations though more intense.  This cramping has been going on for several months and preceded her diagnosis of chlamydia infection.  She assumed that this increased abdominal cramping may be related to her Nexplanon wearing off.  Her Nexplanon was placed about 2 years ago.  She wanted to know what to look out for and when she should be assessed for anything dangerous as regards her abdominal cramping.  ROS: per HPI  Pertinent PMHx: Recent diagnosis of chlamydia  Exam:  Respiratory: Breathing comfortably on room air.  Able to complete long sentences without effort  Assessment/Plan:  Chlamydia She was informed that the diagnosis of chlamydia is serious and can lead to dangerous long-term effects if not treated.  She is informed that her positive test result is most likely true and she should be treated to avoid long-term complications including scarring of the  fallopian tube.  She was also encouraged to abstain from sexual intercourse with her significant other the next 7 to 10 days and encouraged treatment of her significant other.  She was told that she was treated correctly needed no further investigation at this time.  She was informed that her abdominal cramping is not necessarily related to her Nexplanon or to her chlamydial infection.  She was encouraged to be assessed by clinician if her abdominal cramping worsens in severity, leads to nausea/vomiting where she experiences any new vaginal discharge.  She acknowledged understanding and her that she would be assessed if her pain worsened or persisted.  Time spent during visit with patient: 15 minutes

## 2019-09-30 ENCOUNTER — Other Ambulatory Visit: Payer: Self-pay

## 2019-09-30 ENCOUNTER — Ambulatory Visit: Payer: Medicaid Other | Admitting: Family Medicine

## 2019-09-30 ENCOUNTER — Other Ambulatory Visit (HOSPITAL_COMMUNITY)
Admission: RE | Admit: 2019-09-30 | Discharge: 2019-09-30 | Disposition: A | Discharge status: Home / Self Care | Payer: Medicaid Other | Source: Ambulatory Visit | Attending: Family Medicine | Admitting: Family Medicine

## 2019-09-30 ENCOUNTER — Ambulatory Visit (INDEPENDENT_AMBULATORY_CARE_PROVIDER_SITE_OTHER): Payer: Medicaid Other | Admitting: Family Medicine

## 2019-09-30 VITALS — BP 122/75 | HR 76 | Wt 209.2 lb

## 2019-09-30 DIAGNOSIS — Z113 Encounter for screening for infections with a predominantly sexual mode of transmission: Secondary | ICD-10-CM | POA: Insufficient documentation

## 2019-09-30 DIAGNOSIS — R4589 Other symptoms and signs involving emotional state: Secondary | ICD-10-CM | POA: Diagnosis not present

## 2019-09-30 DIAGNOSIS — B9689 Other specified bacterial agents as the cause of diseases classified elsewhere: Secondary | ICD-10-CM | POA: Insufficient documentation

## 2019-09-30 DIAGNOSIS — N760 Acute vaginitis: Secondary | ICD-10-CM

## 2019-09-30 LAB — POCT WET PREP (WET MOUNT)
Clue Cells Wet Prep Whiff POC: POSITIVE
Trichomonas Wet Prep HPF POC: ABSENT

## 2019-09-30 MED ORDER — METRONIDAZOLE 500 MG PO TABS: 500.0000 mg | ORAL_TABLET | Freq: Two times a day (BID) | ORAL | 0 refills | Status: AC

## 2019-09-30 NOTE — Patient Instructions (Addendum)
Thank you for coming to see me today. It was a pleasure. Today we talked about:   You have bacterial vaginosis.  I have sent a prescription to the pharmacy for you for Flagyl, use this twice a day for the next 7 days.  While you are using this, abstain from intercourse and avoid drinking alcohol.  We will contact you with the results of your tests.  You will need to make an appointment in the next few days to come in to have your nexplanon replaced.  It was placed on 10/04/2016.  If it is not removed in the next 4 days, you will need to use a condom for back up contraception, as it cannot be guaranteed that your nexplanon will protect you after 3 years.  Please follow-up in 1-3 days to have your nexplanon replaced.  If you have any questions or concerns, please do not hesitate to call the office at [phone].  Best,   Luis Abed, DO

## 2019-09-30 NOTE — Assessment & Plan Note (Addendum)
PHQ 2 score of 6.  Patient denies SI.  States that she is following with a Veterinary surgeon at work.  Given that this was not revealed into the end of the visit and patient needed to leave to go to work, advised patient to follow-up with PCP regarding this as soon as possible.  She will need a formal PHQ-9 completed at that time.

## 2019-09-30 NOTE — Assessment & Plan Note (Signed)
>>  ASSESSMENT AND PLAN FOR DEPRESSED MOOD WRITTEN ON 09/30/2019  4:13 PM BY MECCARIELLO, BAILEY J, DO  PHQ 2 score of 6.  Patient denies SI.  States that she is following with a Veterinary surgeon at work.  Given that this was not revealed into the end of the visit and patient needed to leave to go to work, advised patient to follow-up with PCP regarding this as soon as possible.  She will need a formal PHQ-9 completed at that time.

## 2019-09-30 NOTE — Assessment & Plan Note (Signed)
Patient presents today for STD testing.  Initially wanted HIV and RPR testing, but at the end of the visit needed to go to work, therefore wanted to delay.  Obtained gonorrhea and chlamydia from throat and vagina.  Trichomonas negative on wet prep.  Patient is due on 10/05/2019 for Nexplanon removal and reinsertion.  She is aware of this.  Advised her to make an appointment the next few days.  Advised her that she cannot make an appointment within 4 days, she should use a condom for backup contraception.

## 2019-09-30 NOTE — Progress Notes (Signed)
    SUBJECTIVE:   CHIEF COMPLAINT / HPI:   STD Testing Patient denies any changes in her vaginal discharge.  Wants checked for STDs as she has a new sexual partner.  Engages in oral sex and would like to have throat swab as well.  Denies anal intercourse.  Would like to have HIV and RPR performed.  Nexplanon placed on 10/04/2016, needs replaced.    Depressed mood PHQ 2 score 6, denies SI.  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   PERTINENT  PMH / PSH: History of chlamydia in October  OBJECTIVE:   BP 122/75   Pulse 76   Wt 209 lb 3.2 oz (94.9 kg)   SpO2 100%   BMI 37.06 kg/m    Physical Exam:  General: 24 y.o. female in NAD Lungs: Breathing comfortably on RA Skin: warm and dry Vaginal Exam: Pelvic exam performed with patient supine.  Chaperone in room.  Bilateral labia without abnormalities, no inguinal LAD palpated.  Cervix exhibits thin, watery discharge, no cervix abnormalities.  No vaginal lesions.  Vaginal discharge thin and watery, white/clear.  Results for orders placed or performed in visit on 09/30/19 (from the past 24 hour(s))  POCT Wet Prep Mellody Drown Kite)     Status: Abnormal   Collection Time: 09/30/19  3:40 PM  Result Value Ref Range   Source Wet Prep POC VAG    WBC, Wet Prep HPF POC 1-5    Bacteria Wet Prep HPF POC Many (A) Few   Clue Cells Wet Prep HPF POC Moderate (A) None   Clue Cells Wet Prep Whiff POC Positive Whiff    Yeast Wet Prep HPF POC None None   KOH Wet Prep POC None None   Trichomonas Wet Prep HPF POC Absent Absent    ASSESSMENT/PLAN:   Screen for sexually transmitted diseases Patient presents today for STD testing.  Initially wanted HIV and RPR testing, but at the end of the visit needed to go to work, therefore wanted to delay.  Obtained gonorrhea and chlamydia from throat and vagina.  Trichomonas negative on wet prep.  Patient is due on 10/05/2019 for Nexplanon removal and reinsertion.  She is  aware of this.  Advised her to make an appointment the next few days.  Advised her that she cannot make an appointment within 4 days, she should use a condom for backup contraception.  Bacterial vaginosis Wet prep consistent with bacterial vaginosis.  Patient educated on bacterial vaginosis.  Flagyl 500 mg twice daily x7 days.  Advised to abstain from alcohol while taking.  Depressed mood PHQ 2 score of 6.  Patient denies SI.  States that she is following with a Veterinary surgeon at work.  Given that this was not revealed into the end of the visit and patient needed to leave to go to work, advised patient to follow-up with PCP regarding this as soon as possible.  She will need a formal PHQ-9 completed at that time.     Unknown Jim, DO [facility]

## 2019-09-30 NOTE — Progress Notes (Deleted)
    SUBJECTIVE:   CHIEF COMPLAINT / HPI:   STD:   Birth control:   PERTINENT  PMH / PSH: ***  OBJECTIVE:   There were no vitals taken for this visit.  ***  ASSESSMENT/PLAN:   No problem-specific Assessment & Plan notes found for this encounter.     Sandre Kitty, MD [facility]

## 2019-09-30 NOTE — Assessment & Plan Note (Signed)
Wet prep consistent with bacterial vaginosis.  Patient educated on bacterial vaginosis.  Flagyl 500 mg twice daily x7 days.  Advised to abstain from alcohol while taking.

## 2019-10-01 ENCOUNTER — Telehealth: Payer: Self-pay | Admitting: Family Medicine

## 2019-10-01 LAB — CERVICOVAGINAL ANCILLARY ONLY
Chlamydia: NEGATIVE
Chlamydia: NEGATIVE
Comment: NEGATIVE
Comment: NEGATIVE
Comment: NORMAL
Comment: NORMAL
Neisseria Gonorrhea: NEGATIVE
Neisseria Gonorrhea: NEGATIVE

## 2019-10-01 NOTE — Telephone Encounter (Signed)
Spoke with pt regarding PHQ2 score of 6 yesterday.  She admits she has been under a lot of stress, especially with school and work, but it is not overwhelming and she is working with a Veterinary surgeon at school regarding this.  She does endorse disordered sleeping patterns and disordered eating, stating she will sometimes go multiple days without eating something.  Informed her we can discuss this at her next visit on the 1st.  No thoughts of self harm. No concerns for manic episodes.

## 2019-10-16 ENCOUNTER — Ambulatory Visit (INDEPENDENT_AMBULATORY_CARE_PROVIDER_SITE_OTHER): Payer: Medicaid Other | Admitting: Family Medicine

## 2019-10-16 ENCOUNTER — Other Ambulatory Visit: Payer: Self-pay

## 2019-10-16 ENCOUNTER — Encounter: Payer: Self-pay | Admitting: Family Medicine

## 2019-10-16 VITALS — BP 132/82 | HR 74 | Wt 210.2 lb

## 2019-10-16 DIAGNOSIS — F419 Anxiety disorder, unspecified: Secondary | ICD-10-CM

## 2019-10-16 DIAGNOSIS — F329 Major depressive disorder, single episode, unspecified: Secondary | ICD-10-CM

## 2019-10-16 DIAGNOSIS — Z3046 Encounter for surveillance of implantable subdermal contraceptive: Secondary | ICD-10-CM

## 2019-10-16 MED ORDER — CITALOPRAM HYDROBROMIDE 10 MG PO TABS: 10.0000 mg | ORAL_TABLET | Freq: Every day | ORAL | 1 refills | Status: DC

## 2019-10-16 MED ORDER — HYDROXYZINE HCL 25 MG PO TABS: 25.0000 mg | ORAL_TABLET | Freq: Three times a day (TID) | ORAL | 1 refills | Status: DC | PRN

## 2019-10-16 MED ORDER — ETONOGESTREL 68 MG ~~LOC~~ IMPL
68.0000 mg | DRUG_IMPLANT | Freq: Once | SUBCUTANEOUS | Status: AC
  Administered 2019-10-16: 68 mg via SUBCUTANEOUS

## 2019-10-16 NOTE — Progress Notes (Signed)
SUBJECTIVE:   CHIEF COMPLAINT / HPI:   Contraception: Still having periods monthly, lasting 3-5 days.  No bleeding in between periods.Currently sexually active with one partner. Not using condoms.  Has been with this partner since December.  Last period was march 9-13th.  Depression/anxiety: Patient states she has a history of depression and was taking Prozac in high school for approximately 1 to 2 months before taking herself off of the medication.  States that her anxiety and depression has increased recently with having to balance both school and work.  She states she goes as much as 2 days without eating sometimes because of stress and not thinking about it.  When she goes to the play such as a grocery store she has to mentally prepare herself at a time and if she is in there too long she starts to have heart palpitations and sweaty palms.  3 days ago she states that she had a "breakdown" in which she thought about the different possible ways of committing suicide.  She not specify the different ways but stated she came up with reasons not to go through with each particular way stating "this way is too painful, that way takes too long" for example.  She did not have a plan or attempt to go through this office.  She does not have access to a firearm.  She stays with her boyfriend.  She has not had thoughts of hurting herself since that time, but states she has been "reflecting" on those initials thought she had 3 days ago.  She has an appointment with the school therapist in 3 weeks but can move that up if needed.  PERTINENT  PMH / PSH: Depression, anxiety  OBJECTIVE:   BP 132/82   Pulse 74   Wt 210 lb 3.2 oz (95.3 kg)   SpO2 94%   BMI 37.24 kg/m   General: Alert and oriented.  No acute distress. Skin: Nexplanon felt under the skin of the left arm.  No rash or swelling above the site of the Nexplanon. Psych: Patient's affect is less blunted than expected when she is discussing her  thoughts of depression.  Makes eye contact, normal spontaneous speech.  Smiles intermittently throughout exam.  nexplanon removal/replacement - risks/benefits were discussed with pt and consent form was signed.  Cleaned left arm with alcohol x3, then numbed the area with 1% lidocaine with epi.  betadine was then applied to area.  An incision was made at the distal end of the nexlpanon, which was then removed from the patient's arm.  Approximately one inch inferior to this location, a new nexplanon rod was inserted into the patient's arm using the applicator device.  Patient's arm was then wrapped with gauze and care instructions were provided to the patient.   ASSESSMENT/PLAN:   Contraception management Discussed with pt risks/benefits of Nexplenon.  Pt currently has nexplenon in place.  Removed existing nexplenon, inserted new nexplanon below  The previous.  Pt tolerated procedure well.    Anxiety and depression Scored 27 on PHQ9 and 21 on GAD 7. Both anxiety and depression exacerbated by stress from balancing school and work responsibilities.  Depression presenting mainly through appetite and sleep difficulties. Pt had a self reported 'breakdown' 3 days ago in which she thought about different ways to commit suicide.  She did not attempt to act on these thoughts and has not had these thoughts since that time.  Does not have access to firearms at home. She is  currently seeing a therapist at school and is scheduled to see her again in 3 weeks.  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  Anxiety is worsened by going out in public or crowded places.  Has tried prozac in the past  (2015) after an attempted overdose but took herself off this medication after a month of use. Is willing to try alternative medications today.  - started celexa 10mg  - hydroxyzine 25mg  TID PRN for anxiety - f/u one week - advised pt to move up her therapist appt  to as soon as she was able. , MD [facility]

## 2019-10-16 NOTE — Assessment & Plan Note (Signed)
Scored 27 on PHQ9 and 21 on GAD 7. Both anxiety and depression exacerbated by stress from balancing school and work responsibilities.  Depression presenting mainly through appetite and sleep difficulties. Pt had a self reported 'breakdown' 3 days ago in which she thought about different ways to commit suicide.  She did not attempt to act on these thoughts and has not had these thoughts since that time.  Does not have access to firearms at home. She is currently seeing a therapist at school and is scheduled to see her again in 3 weeks.  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  Anxiety is worsened by going out in public or crowded places.  Has tried prozac in the past  (2015) after an attempted overdose but took herself off this medication after a month of use. Is willing to try alternative medications today.  - started celexa 10mg  - hydroxyzine 25mg  TID PRN for anxiety - f/u one week - advised pt to move up her therapist appt to as soon as she was able. 

## 2019-10-16 NOTE — Assessment & Plan Note (Signed)
Discussed with pt risks/benefits of Nexplenon.  Pt currently has nexplenon in place.  Removed existing nexplenon, inserted new nexplanon below  The previous.  Pt tolerated procedure well.

## 2019-10-16 NOTE — Patient Instructions (Addendum)
I started you on a medicine called citalopram for your anxiety and depression.  This medicine can takes 4 to 6 weeks to start working appropriately.  In the meantime, for your anxiety attacks I would like you to take the Atarax that you have been prescribed previously up to 3 times a day as needed.  I would like to see you back in 6 weeks where we can reevaluate if we need to increase or change your dosage at all.   Call your therapist and move up your appointment.  I would like to see you back in one week.     If you are feeling suicidal or depression symptoms worsen please immediately go to:   24 Hour Availability [facility]  [address] [place], [place] [address]  [phone] or [phone]  . If you are thinking about harming yourself or having thoughts of suicide, or if you know someone who is, seek help right away. . Call your doctor or mental health care provider. . Call 911 or go to a hospital emergency room to get immediate help, or ask a friend or family member to help you do these things. . Call the [place] National Suicide Prevention Lifeline's toll-free, 24-hour hotline at 1-800-273-TALK 870-076-3551) or TTY: 1-800-799-4 TTY (980)039-7788) to talk to a trained counselor. . If you are in crisis, make sure you are not left alone.  . If someone else is in crisis, make sure he or she is not left alone   [facility] [employer] (Domestic Violence, Rape & Victim Assistance (980)[phone]  [facility]  [address], [place]  [address]               [phone] or ([phone]) [phone]  [facility]    (ONLY from 8am-4pm)    [phone]  [facility] (24/7)   3193574540  [place] National Suicide Hotline   (907) 173-7478 Len Childs)     Nexplanon Instructions After Insertion   Keep bandage clean and dry for 24 hours   May use ice/Tylenol/Ibuprofen for  soreness or pain   If you develop fever, drainage or increased warmth from incision site-contact office immediately  Etonogestrel implant What is this medicine? ETONOGESTREL (et oh noe JES trel) is a contraceptive (birth control) device. It is used to prevent pregnancy. It can be used for up to 3 years. This medicine may be used for other purposes; ask your health care provider or pharmacist if you have questions. COMMON BRAND NAME(S): Implanon, Nexplanon What should I tell my health care provider before I take this medicine? They need to know if you have any of these conditions:  abnormal vaginal bleeding  blood vessel disease or blood clots  breast, cervical, endometrial, ovarian, liver, or uterine cancer  diabetes  gallbladder disease  heart disease or recent heart attack  high blood pressure  high cholesterol or triglycerides  kidney disease  liver disease  migraine headaches  seizures  stroke  tobacco smoker  an unusual or allergic reaction to etonogestrel, anesthetics or antiseptics, other medicines, foods, dyes, or preservatives  pregnant or trying to get pregnant  breast-feeding How should I use this medicine? This device is inserted just under the skin on the inner side of your upper arm by a health care professional. Talk to your pediatrician regarding the use of this medicine in children. Special care may be needed. Overdosage: If you think you have taken  too much of this medicine contact a poison control center or emergency room at once. NOTE: This medicine is only for you. Do not share this medicine with others. What if I miss a dose? This does not apply. What may interact with this medicine? Do not take this medicine with any of the following medications:  amprenavir  fosamprenavir This medicine may also interact with the following medications:  acitretin  aprepitant  armodafinil  bexarotene  bosentan  carbamazepine  certain  medicines for fungal infections like fluconazole, ketoconazole, itraconazole and voriconazole  certain medicines to treat hepatitis, HIV or AIDS  cyclosporine  felbamate  griseofulvin  lamotrigine  modafinil  oxcarbazepine  phenobarbital  phenytoin  primidone  rifabutin  rifampin  rifapentine  St. John's wort  topiramate This list may not describe all possible interactions. Give your health care provider a list of all the medicines, herbs, non-prescription drugs, or dietary supplements you use. Also tell them if you smoke, drink alcohol, or use illegal drugs. Some items may interact with your medicine. What should I watch for while using this medicine? This product does not protect you against HIV infection (AIDS) or other sexually transmitted diseases. You should be able to feel the implant by pressing your fingertips over the skin where it was inserted. Contact your doctor if you cannot feel the implant, and use a non-hormonal birth control method (such as condoms) until your doctor confirms that the implant is in place. Contact your doctor if you think that the implant may have broken or become bent while in your arm. You will receive a user card from your health care provider after the implant is inserted. The card is a record of the location of the implant in your upper arm and when it should be removed. Keep this card with your health records. What side effects may I notice from receiving this medicine? Side effects that you should report to your doctor or health care professional as soon as possible:  allergic reactions like skin rash, itching or hives, swelling of the face, lips, or tongue  breast lumps, breast tissue changes, or discharge  breathing problems  changes in emotions or moods  coughing up blood  if you feel that the implant may have broken or bent while in your arm  high blood pressure  pain, irritation, swelling, or bruising at the insertion  site  scar at site of insertion  signs of infection at the insertion site such as fever, and skin redness, pain or discharge  signs and symptoms of a blood clot such as breathing problems; changes in vision; chest pain; severe, sudden headache; pain, swelling, warmth in the leg; trouble speaking; sudden numbness or weakness of the face, arm or leg  signs and symptoms of liver injury like dark yellow or brown urine; general ill feeling or flu-like symptoms; light-colored stools; loss of appetite; nausea; right upper belly pain; unusually weak or tired; yellowing of the eyes or skin  unusual vaginal bleeding, discharge Side effects that usually do not require medical attention (report to your doctor or health care professional if they continue or are bothersome):  acne  breast pain or tenderness  headache  irregular menstrual bleeding  nausea This list may not describe all possible side effects. Call your doctor for medical advice about side effects. You may report side effects to FDA at 1-800-FDA-1088. Where should I keep my medicine? This drug is given in a hospital or clinic and will not be stored at  home. NOTE: This sheet is a summary. It may not cover all possible information. If you have questions about this medicine, talk to your doctor, pharmacist, or health care provider.  2020 Elsevier/Gold Standard (2019-04-15 11:33:04)

## 2019-10-21 ENCOUNTER — Telehealth (INDEPENDENT_AMBULATORY_CARE_PROVIDER_SITE_OTHER): Payer: Self-pay | Admitting: Family Medicine

## 2019-10-21 ENCOUNTER — Other Ambulatory Visit: Payer: Self-pay

## 2019-10-21 DIAGNOSIS — F419 Anxiety disorder, unspecified: Secondary | ICD-10-CM

## 2019-10-21 DIAGNOSIS — F329 Major depressive disorder, single episode, unspecified: Secondary | ICD-10-CM

## 2019-10-21 NOTE — Progress Notes (Signed)
[facility] Telemedicine Visit  Patient consented to have virtual visit. Method of visit: Telephone  Encounter participants: Patient: Jo [name] - located at home Provider: Sandre Kitty - located at [facility] Others (if applicable): none  Chief Complaint: depression/anxiety  HPI:  Depression:  Pt has rescheduled her therpaist appt to April 12th.  Things are going 'a little better'.  Went to the movies with her boyfriend yesterday.  No thoughts of suicide, no longer thinking 'I wish I weren't here', instead thinking 'things still suck'.  She is 'very hopeful' that this will get better  Only took the atarax yesterday before going to the movies. She hasn't taken the ssri yet bc this weekend was her birthday weekend and she didn't want start it while consuming alcohol on the same day. Talked with classmates about struggling in school and feels better knowing others are going through the same thing.   ROS: per HPI  Pertinent PMHx: anxiety/depression  Exam:  Respiratory: speaking in full sentences.   Assessment/Plan:  Anxiety and depression Pt appears to be doing better. No thoughts of self harm.  Has only tried the atarax once before going out to a movie. Will start the ssri tonight.  Has moved her therapy appt up to soonest available date.  Scheduled pt appointment with me in may to reassess depression.      Time spent during visit with patient: 15 minutes

## 2019-10-21 NOTE — Assessment & Plan Note (Signed)
Pt appears to be doing better. No thoughts of self harm.  Has only tried the atarax once before going out to a movie. Will start the ssri tonight.  Has moved her therapy appt up to soonest available date.  Scheduled pt appointment with me in may to reassess depression.

## 2019-11-25 ENCOUNTER — Other Ambulatory Visit: Payer: Self-pay

## 2019-11-25 ENCOUNTER — Encounter: Payer: Self-pay | Admitting: Family Medicine

## 2019-11-25 ENCOUNTER — Ambulatory Visit (INDEPENDENT_AMBULATORY_CARE_PROVIDER_SITE_OTHER): Payer: Self-pay | Admitting: Family Medicine

## 2019-11-25 DIAGNOSIS — Z3009 Encounter for other general counseling and advice on contraception: Secondary | ICD-10-CM

## 2019-11-25 DIAGNOSIS — G43009 Migraine without aura, not intractable, without status migrainosus: Secondary | ICD-10-CM

## 2019-11-25 DIAGNOSIS — F329 Major depressive disorder, single episode, unspecified: Secondary | ICD-10-CM

## 2019-11-25 DIAGNOSIS — F419 Anxiety disorder, unspecified: Secondary | ICD-10-CM

## 2019-11-25 MED ORDER — BUPROPION HCL ER (SR) 150 MG PO TB12: 150.0000 mg | ORAL_TABLET | Freq: Two times a day (BID) | ORAL | 0 refills | Status: DC

## 2019-11-25 NOTE — Progress Notes (Addendum)
    SUBJECTIVE:   CHIEF COMPLAINT / HPI:   Anxiety/depression: States that things are going much better since starting Celexa and Hydroxyzine. PHQ9 is 9 today down from 27 and GAD 7 is 4 today down from 21 prior to starting these medications at last office visit. She is also seeing her school therapist and met with her every 2 weeks, though she will not be seeing them during the summer. She just finished her exams and has noticed increased fatigue and has been sleeping a lot more, and she is unsure if this is just because she is able to relax now or a medication side effect. No changes in bowel movements or weight gain. She has noted a decrease in sex drive since starting the Celexa and said that it is somewhat bothersome to her and her relationship but states that her relationship is stable and not suffering because of this. No SI/HI. She states that she happy where she is at now and current doses of medication.   Headaches: she additionally notes history of unilateral headaches that respond to aleve and Advil. These headaches have been present for years but believes that they have decreased in frequency since starting depression medications. She has about 2 per month. She endorses sensitivity to light and noise during headaches, and she can feel them coming on when they do occur. Headaches are usually relieved by laying in a dark romm and/or taking a nap. Believes that headaches are related to her menstrual cycle.   Contraception management: can feel Nexplanon in her arm and will randomly feel sharp pain when she moves her arm in different ways. The pain lasts a second or two.  This happens once or twice a week. No swelling in the area. No fevers.   PERTINENT  PMH / PSH: Depression and anxiety  OBJECTIVE:   BP 110/68   Pulse 98   Ht 5' 4" (1.626 m)   Wt 213 lb (96.6 kg)   LMP 11/25/2019   SpO2 98%   BMI 36.56 kg/m   Physical Exam  Constitutional: She is well-developed, well-nourished, and  in no distress. No distress.  Cardiovascular: Normal heart sounds.  Pulmonary/Chest: Effort normal.  Musculoskeletal:     Comments: Nexplanon insertion site well healed. Nexplanon palpable and in place, non-mobile.  Psychiatric: Mood and affect normal.     ASSESSMENT/PLAN:   Anxiety and depression She is doing very well overall and in markedly increased mood. Continue current doses of Celexa and Hydroxyzine. Providing her with a list of therapists that she can see during the summer as she does not have access to her school therapist during this time. Adding Bupropion 150 mg daily to mitigate decreased libido caused by Celexa.  Contraception management There is no swelling, erythema, or pain around site of insertion. Nexplanon is palpable and non-mobile. The pain she is feeling is likely caused by the Nexplanon rubbing against a nerve in certain arm positions. This will likely desensitize with time  Migraines The description of her headaches are most consistent with Migraines. As she is only having them 2 times a month and they respond to Advil and Aleve, we will continue to monitor for worsening symptoms or increased frequency before starting migraine specific therapy. Recommended doing a Headache diary to keep track of triggers of headaches.    Daniel K Olson, MD [facility]  

## 2019-11-25 NOTE — Assessment & Plan Note (Addendum)
There is no swelling, erythema, or pain around site of insertion. Nexplanon is palpable and non-mobile. The pain she is feeling is likely caused by the Nexplanon rubbing against a nerve in certain arm positions. This will likely desensitize with time

## 2019-11-25 NOTE — Assessment & Plan Note (Signed)
She is doing very well overall and in markedly increased mood. Continue current doses of Celexa and Hydroxyzine. Providing her with a list of therapists that she can see during the summer as she does not have access to her school therapist during this time. Adding Bupropion 150 mg daily to mitigate decreased libido caused by Celexa.

## 2019-11-25 NOTE — Progress Notes (Deleted)
    SUBJECTIVE:   CHIEF COMPLAINT / HPI:   Anxiety/depression:  Fatigue  Loss of sexual desire  Migraines  New therapist  PERTINENT  PMH / PSH: ***  OBJECTIVE:   There were no vitals taken for this visit.  ***  ASSESSMENT/PLAN:   No problem-specific Assessment & Plan notes found for this encounter.     Sandre Kitty, MD [facility]

## 2019-11-25 NOTE — Patient Instructions (Addendum)
It was nice to see you again today.  I am glad you are feeling much better.  Below I have included a list of therapy resources for you to look over.  My personal favorite is family services the [place], but you can research the ones listed.  We could also consider adding a medication called bupropion later.  I would like to see you back in 2 months.  We can discuss if the therapy is working or not or if you would like to start the additional medication.  Have a great day,  Jo Jericho, MD  Therapy and Counseling Resources Most providers on this list will take Medicaid. Patients with commercial insurance or Medicare should contact their insurance company to get a list of in network providers.  [facility] [address], [address]  [place], [place] [address]       [phone]     [facility] [address] [place], [place]  [phone]    [facility] [address] [place], [place] [phone]  [facility]:  [address] [place] [phone]  [facility]:  [address] [place], [place] [phone]  [facility] (under & uninsured) [address]   [place] [phone]    [email or web address]    [facility] [place] -[address]     Phone:  [phone]     [place]-  [address]  [phone]   [facility] [address]      [place], [place] [phone]  [facility]: [address] [place], [place], [place]  [phone]   [facility] (Spanish therapist) [address] [place]  [address]   [place] [address]    [phone]    [facility]   [address],  [place], [place]  [phone]   [facility]  [address] [place]  [phone]  [facility]  [address] [place], [place]         [phone]  [facility] under & uninsured [place],  [address], [place] [phone]):  Mon - Fri from 8 AM - 3 PM  [facility] [address],  (Spanish)   [address], [place]: [phone]) 8:30 - 12; 1 - 2:30  [facility] [employer],  [address] [place], [place]    [phone]):8:30 - 12; 2 - 3PM  [facility] [employer],  [address],  [place]; [phone]):   Mon - Fri 8 AM - 5 PM  [facility] [address] [place]  MWF 12:30 to 3:00 or call to schedule an appointment  [phone]  Specific Provider options Psychology Today  [email or web address]/us 1. click on find a therapist  2. enter your zip code 3. left side and select or tailor a therapist for your specific need.   [facility] Provider Directory [email or web address]  (Medicaid)   Follow all drop down to find a provider  Social Support program [facility] [phone] or [employer] [address] [place], [place] Recovery support and educational   In home counseling [facility] Telephone: [phone]  office in [place] [email or web address]   Does not take reg. Medicaid or Medicare private insurance [facility], [place] health Choice, [place], [place], [place], [place], [place] Health Choice  24- Hour Availability:  . [facility]   [phone] or [phone]  . [facility] [phone]  [facility]  [phone]   . [facility] [employer]  [phone] (after hours)  . [facility]   [phone]  . [place] National Suicide Hotline  [phone] (TALK)  . Call 911 or go to emergency room  . [employer]  [phone]);  [place] and [employer]   . [facility]  [phone]); [place], [place], [place], [place],  [place], [place], [place]  Bupropion sustained-release tablets (Depression/Mood Disorders) What is this medicine? BUPROPION (byoo PROE pee on) is used to treat depression. This medicine may be used for other purposes; ask your health care provider or pharmacist if you have questions. COMMON BRAND NAME(S): Budeprion SR, Wellbutrin SR What should I tell my health care provider before I take this medicine? They need to know if you have any of these conditions:  an eating disorder, such as anorexia or bulimia  bipolar disorder or psychosis  diabetes or high blood sugar, treated with medication  glaucoma  head injury or brain tumor  heart disease, previous heart attack, or irregular heart beat  high blood pressure  kidney or liver disease  seizures  suicidal thoughts or a previous suicide attempt  Tourette's syndrome  weight loss  an unusual or allergic reaction to bupropion, other medicines, foods, dyes, or preservatives  breast-feeding  pregnant or trying to become pregnant How should I use this medicine? Take this medicine by mouth with a glass of water. Follow the directions on the prescription label. You can take it with or without food. If it upsets your stomach, take it with food. Do not cut, crush or chew this medicine. Take your medicine at regular intervals. If you take this medicine more than once a day, take your second dose at least 8 hours after you take your first dose. To limit difficulty in sleeping, avoid taking this medicine at bedtime. Do not take your medicine more often than directed. Do not stop taking this medicine suddenly except upon the advice of your doctor. Stopping this medicine too quickly may cause serious side effects or your condition may worsen. A special MedGuide will be given to you by the pharmacist with each prescription and refill. Be sure to read this information carefully each time. Talk to your pediatrician regarding the use of this  medicine in children. Special care may be needed. Overdosage: If you think you have taken too much of this medicine contact a poison control center or emergency room at once. NOTE: This medicine is only for you. Do not share this medicine with others. What if I miss a dose? If you miss a dose, skip the missed dose and take your next tablet at the regular time. There should be at least 8 hours between doses. Do not take double or extra doses. What may interact with this medicine? Do not take this medicine with any of the following medications:  linezolid  MAOIs like Azilect, Carbex, Eldepryl, Marplan, Nardil, and Parnate  methylene blue (injected into a vein)  other medicines that contain bupropion like Zyban This medicine may also interact with the following medications:  alcohol  certain medicines for anxiety or sleep  certain medicines for blood pressure like metoprolol, propranolol  certain medicines for depression or psychotic disturbances  certain medicines for HIV or AIDS like efavirenz, lopinavir, nelfinavir, ritonavir  certain medicines for irregular heart beat like propafenone, flecainide  certain medicines for Parkinson's disease like amantadine, levodopa  certain medicines for seizures like carbamazepine, phenytoin, phenobarbital  cimetidine  clopidogrel  cyclophosphamide  digoxin  furazolidone  isoniazid  nicotine  orphenadrine  procarbazine  steroid medicines like prednisone or cortisone  stimulant medicines for attention disorders, weight loss, or to stay awake  tamoxifen  theophylline  thiotepa  ticlopidine  tramadol  warfarin This list may not describe all possible interactions. Give your health care provider a list of all the medicines, herbs, non-prescription drugs, or dietary supplements you use. Also tell them if you smoke, drink alcohol, or use illegal drugs. Some items may interact with your medicine. What should I watch for  while using this medicine? Tell your doctor if your symptoms do not get better or if they get worse. Visit your doctor or healthcare provider for regular checks on your progress. Because it may take several weeks to see the full effects of this medicine, it is important to continue your treatment as prescribed by your doctor. This medicine may cause serious skin reactions. They can happen weeks to months after starting the medicine. Contact your healthcare provider right away if you notice fevers or flu-like symptoms with a rash. The rash may be red or purple and then turn into blisters or peeling of the skin. Or, you might notice a red rash with swelling of the face, lips or lymph nodes in your neck or under your arms. Patients and their families should watch out for new or worsening thoughts of suicide or depression. Also watch out for sudden changes in feelings such as feeling anxious, agitated, panicky, irritable, hostile, aggressive, impulsive, severely restless, overly excited and hyperactive, or not being able to sleep. If this happens, especially at the beginning of treatment or after a change in dose, call your healthcare provider. Avoid alcoholic drinks while taking this medicine. Drinking excessive alcoholic beverages, using sleeping or anxiety medicines, or quickly stopping the use of these agents while taking this medicine may increase your risk for a seizure. Do not drive or use heavy machinery until you know how this medicine affects you. This medicine can impair your ability to perform these tasks. Do not take this medicine close to bedtime. It may prevent you from sleeping. Your mouth may get dry. Chewing sugarless gum or sucking hard candy, and drinking plenty of water may help. Contact your doctor if the problem does not go away or is severe. What side effects may I notice from receiving this medicine? Side effects that you should report to your doctor or health care professional as soon  as possible:  allergic reactions like skin rash, itching or hives, swelling of the face, lips, or tongue  breathing problems  changes in vision  confusion  elevated mood, decreased need for sleep, racing thoughts, impulsive behavior  fast or irregular heartbeat  hallucinations, loss of contact with reality  increased blood pressure  rash, fever, and swollen lymph nodes  redness, blistering, peeling or loosening of the skin, including inside the mouth  seizures  suicidal thoughts or other mood changes  unusually weak or tired  vomiting Side effects that usually do not require medical attention (report to your doctor or health care professional if they continue or are bothersome):  constipation  headache  loss of appetite  nausea  tremors  weight loss This list may not describe all possible side effects. Call your doctor for medical advice about side effects. You may report side effects to FDA at 1-800-FDA-1088.  Where should I keep my medicine? Keep out of the reach of children. Store at room temperature between 20 and 25 degrees C (68 and 77 degrees F), away from direct sunlight and moisture. Keep tightly closed. Throw away any unused medicine after the expiration date. NOTE: This sheet is a summary. It may not cover all possible information. If you have questions about this medicine, talk to your doctor, pharmacist, or health care provider.  2020 Elsevier/Gold Standard (2018-09-26 13:55:14)

## 2019-11-27 DIAGNOSIS — G43909 Migraine, unspecified, not intractable, without status migrainosus: Secondary | ICD-10-CM | POA: Insufficient documentation

## 2019-11-27 NOTE — Assessment & Plan Note (Signed)
The description of her headaches are most consistent with Migraines. As she is only having them 2 times a month and they respond to Advil and Aleve, we will continue to monitor for worsening symptoms or increased frequency before starting migraine specific therapy. Recommended doing a Headache diary to keep track of triggers of headaches.

## 2019-12-10 ENCOUNTER — Other Ambulatory Visit: Payer: Self-pay | Admitting: Family Medicine

## 2019-12-18 ENCOUNTER — Other Ambulatory Visit: Payer: Self-pay | Admitting: Family Medicine

## 2020-01-23 ENCOUNTER — Other Ambulatory Visit: Payer: Self-pay | Admitting: Family Medicine

## 2020-05-16 ENCOUNTER — Other Ambulatory Visit: Payer: Self-pay | Admitting: Family Medicine

## 2020-07-10 ENCOUNTER — Telehealth (HOSPITAL_BASED_OUTPATIENT_CLINIC_OR_DEPARTMENT_OTHER): Payer: Self-pay | Admitting: *Deleted

## 2020-07-10 ENCOUNTER — Encounter (HOSPITAL_BASED_OUTPATIENT_CLINIC_OR_DEPARTMENT_OTHER): Payer: Self-pay | Admitting: Emergency Medicine

## 2020-07-10 ENCOUNTER — Other Ambulatory Visit: Payer: Self-pay

## 2020-07-10 ENCOUNTER — Emergency Department (HOSPITAL_BASED_OUTPATIENT_CLINIC_OR_DEPARTMENT_OTHER)
Admission: EM | Admit: 2020-07-10 | Discharge: 2020-07-10 | Disposition: A | Discharge status: Home / Self Care | Payer: BC Managed Care – PPO | Attending: Emergency Medicine | Admitting: Emergency Medicine

## 2020-07-10 DIAGNOSIS — Z20822 Contact with and (suspected) exposure to covid-19: Secondary | ICD-10-CM

## 2020-07-10 DIAGNOSIS — U071 COVID-19: Secondary | ICD-10-CM | POA: Diagnosis not present

## 2020-07-10 DIAGNOSIS — Z7722 Contact with and (suspected) exposure to environmental tobacco smoke (acute) (chronic): Secondary | ICD-10-CM | POA: Insufficient documentation

## 2020-07-10 DIAGNOSIS — R6883 Chills (without fever): Secondary | ICD-10-CM | POA: Diagnosis present

## 2020-07-10 DIAGNOSIS — J069 Acute upper respiratory infection, unspecified: Secondary | ICD-10-CM

## 2020-07-10 LAB — RESP PANEL BY RT-PCR (FLU A&B, COVID) ARPGX2
Influenza A by PCR: NEGATIVE
Influenza B by PCR: NEGATIVE
SARS Coronavirus 2 by RT PCR: POSITIVE — AB

## 2020-07-10 NOTE — ED Notes (Signed)
Pt verbalized understanding of discharge instructions. Signature pad broken.

## 2020-07-10 NOTE — ED Triage Notes (Signed)
Reports boyfriend had a positive covid test.  She is having headache, sore throat, stuffy nose since yesterday.

## 2020-07-10 NOTE — ED Provider Notes (Signed)
[facility] EMERGENCY DEPARTMENT Provider Note   CSN: [id number] Arrival date & time: 07/10/20  2111     History Chief Complaint  Patient presents with  . Covid Exposure    Jo [name] is a 24 y.o. female hx of depression, here presenting with Covid exposure.  Patient states that her boyfriend had symptoms for Covid.  She also has some chills and low-grade temperature and headaches for several days.  She had a home test that was positive for Covid and he also has a positive Covid test.  She is here to confirm Covid and also get a work note.  Patient is fully vaccinated for Covid.  The history is provided by the patient.       Past Medical History:  Diagnosis Date  . Allergy   . Depression   . Self-mutilation   . Sickle cell trait [facility]     Patient Active Problem List   Diagnosis Date Noted  . Migraines 11/27/2019  . Screen for sexually transmitted diseases 09/30/2019  . Bacterial vaginosis 09/30/2019  . Depressed mood 09/30/2019  . Screening for cervical cancer 04/03/2019  . Head trauma 02/04/2016  . Seasonal allergies 10/11/2014  . Contraception management 10/11/2014  . Anxiety and depression 04/12/2014  . Mood disorder (HCC) 04/11/2014  . HYPERHIDROSIS 03/05/2009  . FRACTURE, ANKLE, RIGHT 03/05/2009  . Dermatophytosis of scalp and beard 03/11/2007    Past Surgical History:  Procedure Laterality Date  . ANKLE FRACTURE SURGERY  2010   Bimalleolar fx repair, ORIF, fasciotiomy for compartment syndrome     OB History   No obstetric history on file.     Family History  Problem Relation Age of Onset  . Birth defects Brother        coarctation aorta  . Heart disease Brother   . Diabetes Maternal Grandmother   . Hypertension Maternal Grandmother   . Bipolar disorder Maternal Grandmother   . Cancer Maternal Grandmother        breast  . Hypertension Maternal Grandfather   . Kidney disease Maternal Grandfather   . Heart disease Maternal  Grandfather     Social History   Tobacco Use  . Smoking status: Passive Smoke Exposure - Never Smoker  . Smokeless tobacco: Never Used  . Tobacco comment: mom smokes around her  Substance Use Topics  . Alcohol use: No  . Drug use: No    Home Medications Prior to Admission medications   Medication Sig Start Date End Date Taking? Authorizing Provider  buPROPion (WELLBUTRIN SR) 150 MG 12 hr tablet TAKE 1 TABLET BY MOUTH 2 TIMES DAILY. TAKE ONCE DAILY FOR FIRST 3 DAYS BEFORE INCREASING TO TWICE 05/18/20   Sandre Kitty, MD  cetirizine (ZYRTEC) 10 MG tablet Take 1 tablet (10 mg total) by mouth daily. Patient not taking: Reported on 10/04/2016 10/08/14   Latrelle Dodrill, MD  citalopram (CELEXA) 10 MG tablet TAKE 1 TABLET BY MOUTH EVERY DAY 12/11/19   Sandre Kitty, MD  fluticasone [facility]) 50 MCG/ACT nasal spray Place 2 sprays into both nostrils daily. Patient not taking: Reported on 10/04/2016 10/08/14   Latrelle Dodrill, MD  hydrOXYzine (ATARAX/VISTARIL) 25 MG tablet Take 1 tablet (25 mg total) by mouth 3 (three) times daily as needed for anxiety (anxiety). 10/16/19   Sandre Kitty, MD    Allergies    Apple, Carrot [daucus carota], Strawberry extract, Tomato, Banana, and Bee venom  Review of Systems   Review of Systems  Constitutional: Positive for fatigue and fever.  Respiratory: Positive for cough.   All other systems reviewed and are negative.   Physical Exam Updated Vital Signs BP (!) 131/91 (BP Location: Right Arm)   Pulse 94   Temp 99.2 F (37.3 C) (Oral)   Resp 18   Ht 5\' 3"  (1.6 m)   Wt 95.3 kg   SpO2 98%   BMI 37.20 kg/m   Physical Exam Vitals and nursing note reviewed.  Constitutional:      Appearance: Normal appearance.  HENT:     Head: Normocephalic.  Eyes:     Extraocular Movements: Extraocular movements intact.     Pupils: Pupils are equal, round, and reactive to light.  Cardiovascular:     Rate and Rhythm: Normal rate and regular rhythm.      Pulses: Normal pulses.     Heart sounds: Normal heart sounds.  Pulmonary:     Effort: Pulmonary effort is normal.     Breath sounds: Normal breath sounds.  Abdominal:     General: Abdomen is flat.     Palpations: Abdomen is soft.  Musculoskeletal:        General: Normal range of motion.     Cervical back: Normal range of motion and neck supple.  Skin:    General: Skin is warm.     Capillary Refill: Capillary refill takes less than 2 seconds.  Neurological:     General: No focal deficit present.     Mental Status: She is alert and oriented to person, place, and time.  Psychiatric:        Mood and Affect: Mood normal.     ED Results / Procedures / Treatments   Labs (all labs ordered are listed, but only abnormal results are displayed) Labs Reviewed  RESP PANEL BY RT-PCR (FLU A&B, COVID) ARPGX2    EKG None  Radiology No results found.  Procedures Procedures (including critical care time)  Medications Ordered in ED Medications - No data to display  ED Course  I have reviewed the triage vital signs and the nursing notes.  Pertinent labs & imaging results that were available during my care of the patient were reviewed by me and considered in my medical decision making (see chart for details).    MDM Rules/Calculators/A&P                         Jo [name] is a 24 y.o. female who presented with cough, chills. Patient fully vaccinated. Has positive home covid test.  Boyfriend is also positive.  Since she has a close contact with Covid, she will need to quarantine for 10 days.  She is also tested for Covid and the results are pending right now.   Final Clinical Impression(s) / ED Diagnoses Final diagnoses:  Close exposure to COVID-19 virus  Viral URI    Rx / DC Orders ED Discharge Orders    None       25, MD 07/10/20 2244

## 2020-07-10 NOTE — Discharge Instructions (Signed)
Your significant other has Covid.  Your Covid test is currently pending.  As per the guidelines, you will also need to stay home for 10 days regardless of your Covid results today as you are symptomatic  Take Tylenol for fever  Expect to be stiff and sore and having headaches and neck pain and cough  See your doctor for follow-up  Return to ER if you have worse headaches, vomiting, trouble breathing, oxygen level less than 90%.

## 2020-07-18 ENCOUNTER — Other Ambulatory Visit: Payer: Self-pay | Admitting: Family Medicine

## 2020-07-19 NOTE — Telephone Encounter (Signed)
Can we call her to schedule a f/u appointment for her anxiety/depression?

## 2021-12-20 ENCOUNTER — Encounter: Payer: Self-pay | Admitting: *Deleted

## 2022-01-16 ENCOUNTER — Ambulatory Visit
Admission: RE | Admit: 2022-01-16 | Discharge: 2022-01-16 | Disposition: A | Discharge status: Home / Self Care | Payer: 59 | Source: Ambulatory Visit | Attending: Family Medicine | Admitting: Family Medicine

## 2022-01-16 ENCOUNTER — Encounter: Payer: Self-pay | Admitting: Family Medicine

## 2022-01-16 ENCOUNTER — Ambulatory Visit (INDEPENDENT_AMBULATORY_CARE_PROVIDER_SITE_OTHER): Payer: 59 | Admitting: Family Medicine

## 2022-01-16 VITALS — BP 122/75 | HR 62 | Ht 64.0 in | Wt 227.8 lb

## 2022-01-16 DIAGNOSIS — M25531 Pain in right wrist: Secondary | ICD-10-CM | POA: Insufficient documentation

## 2022-01-16 DIAGNOSIS — R29898 Other symptoms and signs involving the musculoskeletal system: Secondary | ICD-10-CM

## 2022-01-16 MED ORDER — MELOXICAM 7.5 MG PO TABS: 7.5000 mg | ORAL_TABLET | Freq: Every day | ORAL | 0 refills | Status: DC

## 2022-01-16 MED ORDER — MELOXICAM 7.5 MG PO TABS
7.5000 mg | ORAL_TABLET | Freq: Two times a day (BID) | ORAL | 0 refills | Status: AC
Start: 2022-01-16 — End: 2022-01-30

## 2022-01-16 NOTE — Assessment & Plan Note (Signed)
Given location suspect that this is related to extensor digitorum tendon inflammation We will trial patient with Mobic 7.5 mg twice daily for 2 weeks Patient to follow-up in 2 weeks Due to decreased grip strength, will refer to occupational therapy Recommended that patient avoid activities that exacerbate pain

## 2022-01-16 NOTE — Patient Instructions (Addendum)
Please report to [facility] located at: [address]  [place], [place] [address]  You do not need an appointment to have xrays completed.   I will follow up with abnormal results once available.  I have sent a prescription of an anti-inflammatory medication to help decrease some of the inflammation in your wrist.  You do not need to wear a wrist brace unless it is comfortable for you.  If you do wear 1 I recommend only wearing it when you are increasing your activity with your wrist.  I will also submit a referral to Occupational Therapy.  Please be on the look out for a call from their office to schedule an appointment.  Please take this twice daily for the next 2 weeks.   Please follow-up with our office in 2 weeks to assess how your pain is doing.

## 2022-01-16 NOTE — Progress Notes (Signed)
    SUBJECTIVE:   CHIEF COMPLAINT / HPI:  R wrist pain   Patient presents for right wrist pain that has been present for close to 7 years  Her left wrist is normal  She reports shooting pain whenever she flexes or extends the wrist  She is unable to do push ups due to pain  She is unable to open bottles or carry heavy things  And it is hard to grip a comb  She is right hand dominant She denies injury around the time that the pain started a few years ago  Symptoms have worsened in the last year and a half  She denies fractures to the right arm  She has not had xrays of the wrist  She has been wearing a brace around the wrist  She has 2-3 days of pain relief before it returns  Pain sometimes helped by taking Tylenol x strength  It has been specifically painful for 1.5 week this time  Denies noticing swelling of the wrist   PERTINENT  PMH / PSH:  Migraines  Hx of right ankle fracture   OBJECTIVE:   BP 122/75   Pulse 62   Ht 5\' 4"  (1.626 m)   Wt 227 lb 12.8 oz (103.3 kg)   LMP 01/06/2022 (Exact Date)   SpO2 98%   BMI 39.10 kg/m   Hand: Inspection: No obvious deformity. No swelling, erythema or bruising Palpation:  TTP along extensor digitorum specifically towards second digit  ROM: Full ROM of the digits and wrist Strength: 5/5 strength in the forearm, wrist and interosseus muscles on left hand, 4/5 strength on right  Neurovascular: NV intact Special tests: Negative finkelstein's, negative tinel's at the carpal tunnel, negative Phalen's and reverse Phalen's   ASSESSMENT/PLAN:   Right wrist pain Given location suspect that this is related to extensor digitorum tendon inflammation We will trial patient with Mobic 7.5 mg twice daily for 2 weeks Patient to follow-up in 2 weeks Due to decreased grip strength, will refer to occupational therapy Recommended that patient avoid activities that exacerbate pain     01/08/2022, MD [facility]  Center

## 2022-01-18 NOTE — Therapy (Signed)
OUTPATIENT OCCUPATIONAL THERAPY ORTHO EVALUATION  Patient Name: Jo [name] MRN: [id number] DOB:12-23-1995, 26 y.o., female Today's Date: 01/19/2022  PCP: Shelby Mattocks, DO REFERRING PROVIDER: Bonner Puna. Manson Passey   OT End of Session - 01/19/22 1214     Visit Number 1    Number of Visits 21    Date for OT Re-Evaluation 03/30/22    Authorization Type [employer] verification    OT Start Time 1015    OT Stop Time 1100    OT Time Calculation (min) 45 min    Activity Tolerance Patient tolerated treatment well    Behavior During Therapy WFL for tasks assessed/performed             Past Medical History:  Diagnosis Date   Allergy    Depression    Self-mutilation    Sickle cell trait (HCC)    Past Surgical History:  Procedure Laterality Date   ANKLE FRACTURE SURGERY  2010   Bimalleolar fx repair, ORIF, fasciotiomy for compartment syndrome   Patient Active Problem List   Diagnosis Date Noted   Right wrist pain 01/16/2022   Migraines 11/27/2019   Screen for sexually transmitted diseases 09/30/2019   Bacterial vaginosis 09/30/2019   Depressed mood 09/30/2019   Screening for cervical cancer 04/03/2019   Head trauma 02/04/2016   Seasonal allergies 10/11/2014   Contraception management 10/11/2014   Anxiety and depression 04/12/2014   Mood disorder (HCC) 04/11/2014   HYPERHIDROSIS 03/05/2009   FRACTURE, ANKLE, RIGHT 03/05/2009   Dermatophytosis of scalp and beard 03/11/2007    ONSET DATE: MD visit 01/18/22, worse over last 1.5 wks (started years ago, but worsened over last 1.5 wks)  REFERRING DIAG: M25.531 (ICD-10-CM) - Right wrist pain R29.898 (ICD-10-CM) - Decreased grip strength of right hand   THERAPY DIAG:  Pain in right wrist  Pain in right hand  Muscle weakness (generalized)  Stiffness of right wrist, not elsewhere classified  Localized edema  Other lack of coordination  Rationale for Evaluation and Treatment  Rehabilitation  SUBJECTIVE:   SUBJECTIVE STATEMENT: Pt reports that whole RUE feels tired, pain is better, extension is better with pain meds  Pt accompanied by: self  PERTINENT HISTORY: Pt reports that she started noticing weakness x7 years.  Then intermittent pain over last 1.5 yrs, over the last 2-4 weeks it began to affect ADLs more.  Per MD notes:  Given location suspect that this is related to extensor digitorum tendon inflammation and x-ray noted slight ulnar minus variance.   PMH:   depression, migraines, anxiety.  Pt denies any medication allergies, hx of seizures, heart conditions/pacemaker.    PRECAUTIONS: None  WEIGHT BEARING RESTRICTIONS No  PAIN:  Are you having pain? Yes: NPRS scale: 3  (3-10)/10 Pain location: R radial wrist  Pain description: dull, tired  Aggravating factors: use, brushing hair, bending wrist Relieving factors: medication, rest  FALLS: Has patient fallen in last 6 months? 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  Pt reports difficulty and needs assist for de-tangling hair and opening bottles, lifting objects, and using tools/screwdriver.  Pt reports pain/difficulty with  writing, typing, using mouse, using phone weight Lifting.  Pt reports performing cleaning tasks and drives with nondominant LUE.  Pt is planning her wedding and considering graduate school for neuro-psychology  Transfers/ambulation related to ADLs:  independent  FUNCTIONAL OUTCOME MEASURES: Upper Extremity Functional Scale (UEFS):  31/80.  UPPER EXTREMITY ROM      R thumb able to oppose to each digit with difficulty/decr speed and base of 5th digit, finger abduction/adduction WNL, gross finger flex/ext WNL, able to perform isolated IP flex and isolated MP flex  Active ROM Right eval Left eval  Shoulder flexion WNL   Shoulder abduction WNL   Shoulder adduction WNL   Shoulder extension WNL   Shoulder internal rotation WNL   Shoulder external rotation WNL   Elbow flexion WNL   Elbow extension WNL   Wrist flexion 60* with 5/10 pain 65*  Wrist extension 85* with 6/10 pain (pain improved with elbow in ext) 90*  Wrist ulnar deviation 25* 45*  Wrist radial deviation 15* 20*  Wrist pronation WNL   Wrist supination WNL   (Blank rows = not tested)   UPPER EXTREMITY MMT:   RUE grossly WNL except wrist flex/ext with pain and pt reports feeling very tired after testing   HAND FUNCTION: Grip strength: Right: 17.6 lbs; Left: 39 lbs, Lateral pinch: Right: 4 lbs, Left: 8 lbs, 3 point pinch: Right: 1.5 lbs (with significantly incr pain), Left: 12 lbs, and Tip pinch: Right 4 lbs, Left: 10 lbs  COORDINATION: 9 Hole Peg test: Right: 28.10 sec; Left: 23.76 sec  SENSATION: Pt reports dull tingling at times with fatigue, and possible mild numbness.  EDEMA: mild edema noted at dorsal radial wrist and base of thumb dorsally  COGNITION: Overall cognitive status: Within functional limits for tasks assessed    TODAY'S TREATMENT:  See pt education   PATIENT EDUCATION: Education details: wrist flex PROM (to stretch wrist extensors); use of ice for 2-3x/day, basic activity modifications (avoid lifting; avoid heavy activity with RUE; strategies for lifting and using/holding phone, issued tan foam for pen for writing) Person educated: Patient Education method: Explanation, Demonstration, and Handouts Education comprehension: verbalized understanding and returned demonstration   HOME EXERCISE PROGRAM: 01/19/22:  wrist  flex PROM (to stretch extensors; use of ice; basic activity modifications (issued tan foam grip for writing)  GOALS: Possible Goals reviewed with patient? Yes  SHORT TERM GOALS: Target date: 02/16/2022    Pt will be independent with initial HEP. Goal status: INITIAL  2.  Pt will demo at wrist ROM WNL with pain less than or equal to 2/10 Baseline:  5-6/10 Goal status: INITIAL  3.  Pt will use dominant RUE for light ADLs at least 90% of the time. Goal status: INITIAL  4.  Pt will verbalize understanding of adaptive strategies for ADLs/IADLs and exercise to decr pain and risk of injury. Goal status: INITIAL  5.  Pt will be independent with splint wear/care prn. Goal status: INITIAL   LONG TERM GOALS: Target date: 03/30/2022    Pt will be independent with updated HEP. Goal status: INITIAL  2.  Pt will improve Upper Extremity Functional Scale to at least 51/80 for improved dominant RUE functional use. Baseline: 31/80 Goal status: INITIAL  3.  Pt will report pain consistently less than 2/10 for ADLs/IADLs. Baseline: 3-10/10 Goal status: INITIAL  4.  Pt will improve R grip strength by at least 12lbs for ADLs. Baseline:  17.6lbs Goal status: INITIAL  5.  Pt will improve  R 3 point pinch to at least 8lbs to assist with ADLs. Baseline: 1.5lbs Goal status: INITIAL    ASSESSMENT:  CLINICAL IMPRESSION: Patient is a 26 y.o. female who was seen today for occupational therapy evaluation for R wrist pain and decr strength in R hand.   Pt reports that she started noticing weakness x7 years.  Then intermittent pain over last 1.5 yrs, over the last 2-4 weeks it began to affect ADLs more where she has needed assist for ADLs/IADLs, incr pain, and having to use nondominant LUE.   Per MD notes:  Given location suspect that this is related to extensor digitorum tendon inflammation and x-ray noted slight ulnar minus variance.  Pt with PMH that includes:  depression, migraines, anxiety.  Pt  presents today with pain, edema, decr ROM, decr strength affecting RUE functional use.  Pt would benefit from occupational therapy to address these deficits for improved dominant RUE functional use, decr pain, and incr ease with ADLs/IADLs.  PERFORMANCE DEFICITS in functional skills including ADLs, IADLs, coordination, dexterity, sensation, edema, ROM, strength, pain, FMC, decreased knowledge of precautions, decreased knowledge of use of DME, and UE functional use, cognitive skills including  and psychosocial skills including habits.   IMPAIRMENTS are limiting patient from ADLs, IADLs, education, work, leisure, and social participation.   COMORBIDITIES has no other co-morbidities that affects occupational performance. Patient will benefit from skilled OT to address above impairments and improve overall function.  MODIFICATION OR ASSISTANCE TO COMPLETE EVALUATION: No modification of tasks or assist necessary to complete an evaluation.  OT OCCUPATIONAL PROFILE AND HISTORY: Problem focused assessment: Including review of records relating to presenting problem.  CLINICAL DECISION MAKING: LOW - limited treatment options, no task modification necessary  REHAB POTENTIAL: Good  EVALUATION COMPLEXITY: Low      PLAN: OT FREQUENCY: 2x/week  OT DURATION: 10 weeks +eval   PLANNED INTERVENTIONS: self care/ADL training, therapeutic exercise, therapeutic activity, neuromuscular re-education, manual therapy, passive range of motion, aquatic therapy, splinting, electrical stimulation, ultrasound, iontophoresis, paraffin, fluidotherapy, moist heat, cryotherapy, contrast bath, patient/family education, and DME and/or AE instructions  RECOMMENDED OTHER SERVICES: none at this time  CONSULTED AND AGREED WITH PLAN OF CARE: Patient  PLAN FOR NEXT SESSION: fludio, ultrasound, gentle ROM, soft tissue mobs, check current brace/make splinting recommendations prn, activity modification (Note pt only currently  scheduled for 5 wks due to unsure of school schedule)   Gevon Markus, OTR/L 01/19/2022, 3:26 PM

## 2022-01-19 ENCOUNTER — Encounter: Payer: Self-pay | Admitting: Occupational Therapy

## 2022-01-19 ENCOUNTER — Ambulatory Visit: Payer: 59 | Attending: Family Medicine | Admitting: Occupational Therapy

## 2022-01-19 DIAGNOSIS — M25631 Stiffness of right wrist, not elsewhere classified: Secondary | ICD-10-CM | POA: Diagnosis present

## 2022-01-19 DIAGNOSIS — R278 Other lack of coordination: Secondary | ICD-10-CM | POA: Insufficient documentation

## 2022-01-19 DIAGNOSIS — M25531 Pain in right wrist: Secondary | ICD-10-CM | POA: Insufficient documentation

## 2022-01-19 DIAGNOSIS — R600 Localized edema: Secondary | ICD-10-CM | POA: Insufficient documentation

## 2022-01-19 DIAGNOSIS — M6281 Muscle weakness (generalized): Secondary | ICD-10-CM | POA: Insufficient documentation

## 2022-01-19 DIAGNOSIS — M79641 Pain in right hand: Secondary | ICD-10-CM | POA: Diagnosis present

## 2022-01-19 NOTE — Patient Instructions (Signed)
   Wrist Passive Flexion    Rest right forearm on table, hand palm-down over edge. Bend wrist by pressing hand down with other hand. Hold 20 seconds. Repeat 3-5 times. Do 3-4 sessions per day.

## 2022-01-22 NOTE — Therapy (Signed)
OUTPATIENT OCCUPATIONAL THERAPY ORTHO TREATMENT  Patient Name: Jo [name] MRN: [id number] DOB:1996/03/03, 26 y.o., female Today's Date: 01/22/2022  PCP: Shelby Mattocks, DO REFERRING PROVIDER: Bonner Puna. Manson Passey     Past Medical History:  Diagnosis Date   Allergy    Depression    Self-mutilation    Sickle cell trait [facility])    Past Surgical History:  Procedure Laterality Date   ANKLE FRACTURE SURGERY  2010   Bimalleolar fx repair, ORIF, fasciotiomy for compartment syndrome   Patient Active Problem List   Diagnosis Date Noted   Right wrist pain 01/16/2022   Migraines 11/27/2019   Screen for sexually transmitted diseases 09/30/2019   Bacterial vaginosis 09/30/2019   Depressed mood 09/30/2019   Screening for cervical cancer 04/03/2019   Head trauma 02/04/2016   Seasonal allergies 10/11/2014   Contraception management 10/11/2014   Anxiety and depression 04/12/2014   Mood disorder (HCC) 04/11/2014   HYPERHIDROSIS 03/05/2009   FRACTURE, ANKLE, RIGHT 03/05/2009   Dermatophytosis of scalp and beard 03/11/2007    ONSET DATE: MD visit 01/18/22, worse over last 1.5 wks (started years ago, but worsened over last 1.5 wks)  REFERRING DIAG: M25.531 (ICD-10-CM) - Right wrist pain R29.898 (ICD-10-CM) - Decreased grip strength of right hand   THERAPY DIAG:  No diagnosis found.  Rationale for Evaluation and Treatment Rehabilitation  SUBJECTIVE:   SUBJECTIVE STATEMENT: Pt reports that whole RUE feels tired, pain is better, extension is better with pain meds  Pt accompanied by: self  PERTINENT HISTORY: Pt reports that she started noticing weakness x7 years.  Then intermittent pain over last 1.5 yrs, over the last 2-4 weeks it began to affect ADLs more.  Per MD notes:  Given location suspect that this is related to extensor digitorum tendon inflammation and x-ray noted slight ulnar minus variance.   PMH:   depression, migraines, anxiety.  Pt denies any medication allergies, hx of  seizures, heart conditions/pacemaker.    PRECAUTIONS: None   PAIN:  Are you having pain? 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  Pt reports difficulty and needs assist for de-tangling hair and opening bottles, lifting objects, and using tools/screwdriver.  Pt reports pain/difficulty with writing, typing, using mouse, using phone weight Lifting.  Pt reports performing cleaning tasks and drives with nondominant LUE.  Pt is planning her wedding and considering graduate school for neuro-psychology  Transfers/ambulation related to ADLs:  independent  FUNCTIONAL OUTCOME MEASURES: Upper Extremity Functional Scale (UEFS): 31/80.  UPPER EXTREMITY ROM      R thumb able to oppose to each digit with difficulty/decr speed and base of 5th digit, finger abduction/adduction WNL, gross finger flex/ext WNL, able to perform isolated IP flex and isolated MP flex  Active ROM Right eval Left eval  Shoulder flexion WNL   Shoulder abduction WNL   Shoulder adduction WNL   Shoulder extension WNL   Shoulder internal rotation WNL   Shoulder external rotation WNL   Elbow flexion WNL   Elbow extension WNL   Wrist flexion  60* with 5/10 pain 65*  Wrist extension 85* with 6/10 pain (pain improved with elbow in ext) 90*  Wrist ulnar deviation 25* 45*  Wrist radial deviation 15* 20*  Wrist pronation WNL   Wrist supination WNL   (Blank rows = not tested)   UPPER  EXTREMITY MMT:   RUE grossly WNL except wrist flex/ext with pain and pt reports feeling very tired after testing   HAND FUNCTION: Grip strength: Right: 17.6 lbs; Left: 39 lbs, Lateral pinch: Right: 4 lbs, Left: 8 lbs, 3 point pinch: Right: 1.5 lbs (with significantly incr pain), Left: 12 lbs, and Tip pinch: Right 4 lbs, Left: 10 lbs  COORDINATION: 9 Hole Peg test: Right: 28.10 sec; Left: 23.76 sec  SENSATION: Pt reports dull tingling at times with fatigue, and possible mild numbness.  EDEMA: mild edema noted at dorsal radial wrist and base of thumb dorsally  COGNITION: Overall cognitive status: Within functional limits for tasks assessed    TODAY'S TREATMENT:  See pt education   PATIENT EDUCATION: Education details: wrist flex PROM (to stretch wrist extensors); use of ice for 2-3x/day, basic activity modifications (avoid lifting; avoid heavy activity with RUE; strategies for lifting and using/holding phone, issued tan foam for pen for writing) Person educated: Patient Education method: Explanation, Demonstration, and Handouts Education comprehension: verbalized understanding and returned demonstration   HOME EXERCISE PROGRAM: 01/19/22:  wrist flex PROM (to stretch extensors; use of ice; basic activity modifications (issued tan foam grip for writing)  GOALS: Possible Goals reviewed with patient? Yes  SHORT TERM GOALS: Target date: 02/16/2022    Pt will be independent with initial HEP. Goal status: INITIAL  2.  Pt will demo at wrist ROM WNL with pain less than or equal to 2/10 Baseline:  5-6/10 Goal status: INITIAL  3.  Pt will use dominant RUE for light ADLs at least 90% of the time. Goal status: INITIAL  4.  Pt will verbalize understanding of adaptive strategies for ADLs/IADLs and exercise to decr pain and risk of injury. Goal status: INITIAL  5.  Pt will be independent with splint wear/care prn. Goal status: INITIAL   LONG TERM GOALS: Target date: 03/30/2022     Pt will be independent with updated HEP. Goal status: INITIAL  2.  Pt will improve Upper Extremity Functional Scale to at least 51/80 for improved dominant RUE functional use. Baseline: 31/80 Goal status: INITIAL  3.  Pt will report pain consistently less than 2/10 for ADLs/IADLs. Baseline: 3-10/10 Goal status: INITIAL  4.  Pt will improve R grip strength by at least 12lbs for ADLs. Baseline:  17.6lbs Goal status: INITIAL  5.  Pt will improve R 3 point pinch to at least 8lbs to assist with ADLs. Baseline: 1.5lbs Goal status: INITIAL    ASSESSMENT:  CLINICAL IMPRESSION: *** Patient is a 26 y.o. female who was seen today for occupational therapy evaluation for R wrist pain and decr strength in R hand.   Pt reports that she started noticing weakness x7 years.  Then intermittent pain over last 1.5 yrs, over the last 2-4 weeks it began to affect ADLs more where she has needed assist for ADLs/IADLs, incr pain, and having to use nondominant LUE.   Per MD notes:  Given location suspect that this is related to extensor digitorum tendon inflammation and x-ray noted slight ulnar minus variance.  Pt with PMH that includes:  depression, migraines, anxiety.  Pt presents today with pain, edema, decr ROM, decr strength affecting RUE  functional use.  Pt would benefit from occupational therapy to address these deficits for improved dominant RUE functional use, decr pain, and incr ease with ADLs/IADLs.  PERFORMANCE DEFICITS in functional skills including ADLs, IADLs, coordination, dexterity, sensation, edema, ROM, strength, pain, FMC, decreased knowledge of precautions, decreased knowledge of use of DME, and UE functional use, cognitive skills including  and psychosocial skills including habits.   IMPAIRMENTS are limiting patient from ADLs, IADLs, education, work, leisure, and social participation.   COMORBIDITIES has no other co-morbidities that affects occupational performance. Patient will benefit  from skilled OT to address above impairments and improve overall function.  MODIFICATION OR ASSISTANCE TO COMPLETE EVALUATION: No modification of tasks or assist necessary to complete an evaluation.  OT OCCUPATIONAL PROFILE AND HISTORY: Problem focused assessment: Including review of records relating to presenting problem.  CLINICAL DECISION MAKING: LOW - limited treatment options, no task modification necessary  REHAB POTENTIAL: Good  EVALUATION COMPLEXITY: Low   PLAN: OT FREQUENCY: 2x/week  OT DURATION: 10 weeks +eval   PLANNED INTERVENTIONS: self care/ADL training, therapeutic exercise, therapeutic activity, neuromuscular re-education, manual therapy, passive range of motion, aquatic therapy, splinting, electrical stimulation, ultrasound, iontophoresis, paraffin, fluidotherapy, moist heat, cryotherapy, contrast bath, patient/family education, and DME and/or AE instructions  RECOMMENDED OTHER SERVICES: none at this time  CONSULTED AND AGREED WITH PLAN OF CARE: Patient  PLAN FOR NEXT SESSION: *** fludio, ultrasound, gentle ROM, soft tissue mobs, check current brace/make splinting recommendations prn, activity modification (Note pt only currently scheduled for 5 wks due to unsure of school schedule)   Flornce Record, OTR/L 01/22/2022, 10:03 AM

## 2022-01-23 ENCOUNTER — Ambulatory Visit: Payer: 59 | Admitting: Occupational Therapy

## 2022-01-23 ENCOUNTER — Encounter: Payer: Self-pay | Admitting: Occupational Therapy

## 2022-01-23 DIAGNOSIS — M79641 Pain in right hand: Secondary | ICD-10-CM

## 2022-01-23 DIAGNOSIS — R600 Localized edema: Secondary | ICD-10-CM

## 2022-01-23 DIAGNOSIS — M25531 Pain in right wrist: Secondary | ICD-10-CM | POA: Diagnosis not present

## 2022-01-23 DIAGNOSIS — R278 Other lack of coordination: Secondary | ICD-10-CM

## 2022-01-23 DIAGNOSIS — M6281 Muscle weakness (generalized): Secondary | ICD-10-CM

## 2022-01-23 DIAGNOSIS — M25631 Stiffness of right wrist, not elsewhere classified: Secondary | ICD-10-CM

## 2022-01-23 NOTE — Patient Instructions (Signed)
  AROM: Wrist Extension   .  With right palm down, bend wrist up. Repeat __10__ times per set.  Do __4__ sessions per day.    AROM: Wrist Flexion   With right palm up, bend wrist up. Repeat __10__ times per set.  Do _4___ sessions per day.   AROM: Wrist Radial Deviation    With right thumb up, bend wrist up. Repeat 10 times per set.  Do 4 sessions per day.   Flexor Tendon Gliding (Active Hook Fist)   With fingers and knuckles straight, bend middle and tip joints. Do not bend large knuckles. Repeat 10 times. Do 4 sessions per day.   Flexor Tendon Gliding (Active Full Fist)   Straighten all fingers, then make a fist, bending all joints. Repeat 10 times. Do 4 sessions per day.   Flexor Tendon Gliding (Active Straight Fist)   Start with fingers straight. Bend knuckles and middle joints. Keep fingertip joints straight to touch base of palm. Repeat 10 times. Do 4 sessions per day.   MP Flexion (Active Isolated)   Bend ALL fingers at large knuckle, keeping other fingers straight. Do not bend tips. Repeat 10 times. Do 4 sessions per day.     AROM: Thumb Flexion / Extension    Actively bend right thumb across palm as far as possible. Hold ____ seconds. Relax. Then pull thumb back into hitchhike position. Repeat 10 times per set.  Do 4 sessions per day.

## 2022-01-25 ENCOUNTER — Ambulatory Visit: Payer: 59 | Admitting: Occupational Therapy

## 2022-01-26 ENCOUNTER — Ambulatory Visit: Payer: 59 | Admitting: Occupational Therapy

## 2022-01-26 ENCOUNTER — Encounter: Payer: Self-pay | Admitting: Occupational Therapy

## 2022-01-26 DIAGNOSIS — M79641 Pain in right hand: Secondary | ICD-10-CM

## 2022-01-26 DIAGNOSIS — M25531 Pain in right wrist: Secondary | ICD-10-CM

## 2022-01-26 DIAGNOSIS — M25631 Stiffness of right wrist, not elsewhere classified: Secondary | ICD-10-CM

## 2022-01-26 DIAGNOSIS — R600 Localized edema: Secondary | ICD-10-CM

## 2022-01-26 DIAGNOSIS — R278 Other lack of coordination: Secondary | ICD-10-CM

## 2022-01-26 DIAGNOSIS — M6281 Muscle weakness (generalized): Secondary | ICD-10-CM

## 2022-01-26 NOTE — Therapy (Signed)
OUTPATIENT OCCUPATIONAL THERAPY ORTHO TREATMENT  Patient Name: Jo [name] MRN: [id number] DOB:[birth year], 26 y.o., female Today's Date: 01/26/2022  PCP: Shelby Mattocks, DO REFERRING PROVIDER: Bonner Puna. Manson Passey   OT End of Session - 01/26/22 0855     Visit Number 3    Number of Visits 21    Date for OT Re-Evaluation 03/30/22    Authorization Type [employer]--visit limit 23    OT Start Time [phone]    OT Stop Time 0931    OT Time Calculation (min) 38 min    Activity Tolerance Patient tolerated treatment well    Behavior During Therapy WFL for tasks assessed/performed               Past Medical History:  Diagnosis Date   Allergy    Depression    Self-mutilation    Sickle cell trait (HCC)    Past Surgical History:  Procedure Laterality Date   ANKLE FRACTURE SURGERY  2010   Bimalleolar fx repair, ORIF, fasciotiomy for compartment syndrome   Patient Active Problem List   Diagnosis Date Noted   Right wrist pain 01/16/2022   Migraines 11/27/2019   Screen for sexually transmitted diseases 09/30/2019   Bacterial vaginosis 09/30/2019   Depressed mood 09/30/2019   Screening for cervical cancer 04/03/2019   Head trauma 02/04/2016   Seasonal allergies 10/11/2014   Contraception management 10/11/2014   Anxiety and depression 04/12/2014   Mood disorder (HCC) 04/11/2014   HYPERHIDROSIS 03/05/2009   FRACTURE, ANKLE, RIGHT 03/05/2009   Dermatophytosis of scalp and beard 03/11/2007    ONSET DATE: MD visit 01/18/22, worse over last 1.5 wks (started years ago, but worsened over last 1.5 wks)  REFERRING DIAG: M25.531 (ICD-10-CM) - Right wrist pain R29.898 (ICD-10-CM) - Decreased grip strength of right hand   THERAPY DIAG:  Pain in right wrist  Pain in right hand  Muscle weakness (generalized)  Stiffness of right wrist, not elsewhere classified  Localized edema  Other lack of coordination  Rationale for Evaluation and Treatment Rehabilitation  SUBJECTIVE:    SUBJECTIVE STATEMENT: Pt reports that she cooked and washed dishes yesterday.  Pt reports that arm still feels week, but pain is better.  "I've tried not to be on my phone as much." No pain currently,  1/10 pain at most last 2 days.  Pt also reports that she ordered wrist brace  Pt accompanied by: self  PERTINENT HISTORY: Pt reports that she started noticing weakness x7 years.  Then intermittent pain over last 1.5 yrs, over the last 2-4 weeks it began to affect ADLs more.  Per MD notes:  Given location suspect that this is related to extensor digitorum tendon inflammation and x-ray noted slight ulnar minus variance.   PMH:   depression, migraines, anxiety.  Pt denies any medication allergies, hx of seizures, heart conditions/pacemaker.    PRECAUTIONS: None   PAIN:  Are you having pain? 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   Reviewed AROM HEP.  Pt returned demo each x10.  PROM wrist in flex for wrist/finger extensors stretch (elbow extended), PROM wrist ext stretch  Ultrasound x to R  wrist/hand dorsally (base of thumb/2nd digit) , 0.8wts/cm2, 20% pulsed with no adverse reactions for pain, edema, inflammation  Soft tissue mobs to R radial wrist/forearm for tightness/pain.        PATIENT EDUCATION: Education details: reviewed HEPs and pt returned demo each Person educated: Patient Education method: Explanation, Demonstration Education comprehension: verbalized understanding and returned demonstration   HOME EXERCISE PROGRAM: 01/19/22:  wrist flex PROM (to stretch extensors; use of ice; basic  activity modifications (issued tan foam grip for writing) 01/23/22:  AROM HEP  GOALS: Possible Goals reviewed with patient? Yes  SHORT TERM GOALS: Target date: 02/16/2022    Pt will be independent with initial HEP. Goal status: INITIAL  2.  Pt will demo at wrist ROM WNL with pain less than or equal to 2/10 Baseline:  5-6/10 Goal status: INITIAL  3.  Pt will use dominant RUE for light ADLs at least 90% of the time. Goal status: INITIAL  4.  Pt will verbalize understanding of adaptive strategies for ADLs/IADLs and exercise to decr pain and risk of injury. Goal status: INITIAL  5.  Pt will be independent with splint wear/care prn. Goal status: INITIAL   LONG TERM GOALS: Target date: 03/30/2022    Pt will be independent with updated HEP. Goal status: INITIAL  2.  Pt will improve Upper Extremity Functional Scale to at least 51/80 for improved dominant RUE functional use. Baseline: 31/80 Goal status: INITIAL  3.  Pt will report pain consistently less than 2/10 for ADLs/IADLs. Baseline: 3-10/10 Goal status: INITIAL  4.  Pt will improve R grip strength by at least 12lbs for ADLs. Baseline:  17.6lbs Goal status: INITIAL  5.  Pt will improve R 3 point pinch to at least 8lbs to assist with ADLs. Baseline: 1.5lbs Goal status: INITIAL    ASSESSMENT:  CLINICAL IMPRESSION: Pt is progressing with decr pain and incr ROM noted.   PERFORMANCE DEFICITS in functional skills including ADLs, IADLs, coordination, dexterity, sensation, edema, ROM, strength, pain, FMC, decreased knowledge of precautions, decreased knowledge of use of DME, and UE functional use, cognitive skills including  and psychosocial skills including habits.   IMPAIRMENTS are limiting patient from ADLs, IADLs, education, work, leisure, and social participation.   COMORBIDITIES has no other co-morbidities that affects occupational performance. Patient will benefit from skilled OT to address above impairments and  improve overall function.  MODIFICATION OR ASSISTANCE TO COMPLETE EVALUATION: No modification of tasks or assist necessary to complete an evaluation.  OT OCCUPATIONAL PROFILE AND HISTORY: Problem focused assessment: Including review of records relating to presenting problem.  CLINICAL DECISION MAKING: LOW - limited treatment options, no task modification necessary  REHAB POTENTIAL: Good  EVALUATION COMPLEXITY: Low   PLAN: OT FREQUENCY: 2x/week  OT DURATION: 10 weeks +eval   PLANNED INTERVENTIONS: self care/ADL training, therapeutic exercise, therapeutic activity, neuromuscular re-education, manual therapy, passive range of motion, aquatic therapy, splinting, electrical stimulation, ultrasound, iontophoresis, paraffin, fluidotherapy, moist heat, cryotherapy, contrast bath, patient/family education, and DME and/or AE instructions  RECOMMENDED OTHER SERVICES: none at this time  CONSULTED AND AGREED WITH PLAN OF CARE: Patient  PLAN FOR NEXT SESSION:  fludio, ultrasound, gentle ROM, soft tissue mobs, activity modification, ?light strengthening if pain continues to be at 0-1/10 (light putty, light wt. Vs isometrics). **Note pt only currently scheduled for 5 wks due to unsure of school schedule   [facility], OTR/L 01/26/2022, 8:55 AM

## 2022-01-26 NOTE — Therapy (Signed)
OUTPATIENT OCCUPATIONAL THERAPY ORTHO TREATMENT  Patient Name: Jo [name] MRN: [id number] DOB:10-26-1995, 26 y.o., female Today's Date: 01/30/2022  PCP: Shelby Mattocks, DO REFERRING PROVIDER: Bonner Puna. Manson Passey   OT End of Session - 01/30/22 0942     Visit Number 4    Number of Visits 21    Date for OT Re-Evaluation 03/30/22    Authorization Type [employer]--visit limit 23    OT Start Time [phone]    OT Stop Time 1015    OT Time Calculation (min) 41 min    Activity Tolerance Patient tolerated treatment well    Behavior During Therapy WFL for tasks assessed/performed                Past Medical History:  Diagnosis Date   Allergy    Depression    Self-mutilation    Sickle cell trait (HCC)    Past Surgical History:  Procedure Laterality Date   ANKLE FRACTURE SURGERY  2010   Bimalleolar fx repair, ORIF, fasciotiomy for compartment syndrome   Patient Active Problem List   Diagnosis Date Noted   Right wrist pain 01/16/2022   Migraines 11/27/2019   Screen for sexually transmitted diseases 09/30/2019   Bacterial vaginosis 09/30/2019   Depressed mood 09/30/2019   Screening for cervical cancer 04/03/2019   Head trauma 02/04/2016   Seasonal allergies 10/11/2014   Contraception management 10/11/2014   Anxiety and depression 04/12/2014   Mood disorder (HCC) 04/11/2014   HYPERHIDROSIS 03/05/2009   FRACTURE, ANKLE, RIGHT 03/05/2009   Dermatophytosis of scalp and beard 03/11/2007    ONSET DATE: MD visit 01/18/22, worse over last 1.5 wks (started years ago, but worsened over last 1.5 wks)  REFERRING DIAG: M25.531 (ICD-10-CM) - Right wrist pain R29.898 (ICD-10-CM) - Decreased grip strength of right hand   THERAPY DIAG:  Pain in right wrist  Pain in right hand  Muscle weakness (generalized)  Stiffness of right wrist, not elsewhere classified  Localized edema  Other lack of coordination  Rationale for Evaluation and Treatment Rehabilitation  SUBJECTIVE:    SUBJECTIVE STATEMENT: Had a good weekend--no pain   Pt accompanied by: self  PERTINENT HISTORY: Pt reports that she started noticing weakness x7 years.  Then intermittent pain over last 1.5 yrs, over the last 2-4 weeks it began to affect ADLs more.  Per MD notes:  Given location suspect that this is related to extensor digitorum tendon inflammation and x-ray noted slight ulnar minus variance.   PMH:   depression, migraines, anxiety.  Pt denies any medication allergies, hx of seizures, heart conditions/pacemaker.    PRECAUTIONS: None   PAIN:  Are you having pain? 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   AROM:  Isolated IP flex/ext, isolated MP flex/ext, straight fist, composite finger flexion, wrist flex, wrist ext, UD/RD--Pt returned demo each x10.  PROM wrist in flex for wrist/finger extensors stretch (elbow extended), PROM wrist ext stretch  Ultrasound x to R wrist/hand dorsally (base of thumb/2nd digit) , 0.8wts/cm2, 20% pulsed with no adverse reactions for pain, edema, inflammation     PATIENT EDUCATION: Education details: wrist flex/ext with 1lb wt.,  yellow putty HEP--see pt instructions  Person educated: Patient Education method: Explanation, Demonstration Education comprehension: verbalized understanding and returned demonstration   HOME EXERCISE PROGRAM: 01/19/22:  wrist flex PROM (to stretch extensors; use of ice; basic activity modifications (issued tan foam grip for writing) 01/23/22:  AROM HEP 01/30/22:  wrist flex/ext with 1lb wt, yellow putty HEP  GOALS: Possible Goals  reviewed with patient? Yes  SHORT TERM GOALS: Target date: 02/16/2022    Pt will be independent with initial HEP. Goal status: INITIAL  2.  Pt will demo at wrist ROM WNL with pain less than or equal to 2/10 Baseline:  5-6/10 Goal status: INITIAL  3.  Pt will use dominant RUE for light ADLs at least 90% of the time. Goal status: INITIAL  4.  Pt will verbalize understanding of adaptive strategies for ADLs/IADLs and exercise to decr pain and risk of injury. Goal status: INITIAL  5.  Pt will be independent with splint wear/care prn. Goal status: INITIAL   LONG TERM GOALS: Target date: 03/30/2022    Pt will be independent with updated HEP. Goal status: INITIAL  2.  Pt will improve Upper Extremity Functional Scale to at least 51/80 for improved dominant RUE functional use. Baseline: 31/80 Goal status: INITIAL  3.  Pt will report pain consistently less than 2/10 for ADLs/IADLs. Baseline: 3-10/10 Goal status: INITIAL  4.  Pt will improve R grip strength by at least 12lbs for ADLs. Baseline:  17.6lbs Goal status: INITIAL  5.  Pt will improve R 3 point pinch to at least 8lbs to assist with ADLs. Baseline: 1.5lbs Goal status: INITIAL    ASSESSMENT:  CLINICAL IMPRESSION: Pt is progressing with decr pain and incr ROM and tolerated initiation of light strengthening well.  PERFORMANCE DEFICITS in functional skills including ADLs, IADLs, coordination, dexterity, sensation, edema, ROM, strength, pain, FMC, decreased knowledge of precautions, decreased knowledge of use of DME, and UE functional use, cognitive skills including  and psychosocial skills including habits.   IMPAIRMENTS are limiting patient from ADLs, IADLs, education, work, leisure, and social participation.   COMORBIDITIES has no other co-morbidities that affects occupational performance. Patient will benefit from skilled OT to address above impairments and improve overall function.  MODIFICATION OR ASSISTANCE TO  COMPLETE EVALUATION: No modification of tasks or assist necessary to complete an evaluation.  OT OCCUPATIONAL PROFILE AND HISTORY: Problem focused assessment: Including review of records relating to presenting problem.  CLINICAL DECISION MAKING: LOW - limited treatment options, no task modification necessary  REHAB POTENTIAL: Good  EVALUATION COMPLEXITY: Low   PLAN: OT FREQUENCY: 2x/week  OT DURATION: 10 weeks +eval   PLANNED INTERVENTIONS: self care/ADL training, therapeutic exercise, therapeutic activity, neuromuscular re-education, manual therapy, passive range of motion, aquatic therapy, splinting, electrical stimulation, ultrasound, iontophoresis, paraffin, fluidotherapy, moist heat, cryotherapy, contrast bath, patient/family education, and DME and/or AE instructions  RECOMMENDED OTHER SERVICES: none at this time  CONSULTED AND AGREED WITH PLAN OF CARE: Patient  PLAN FOR NEXT SESSION:   fludio/ultrasound, activity modification prn, review light strengthening HEP? Add thumb flex/ext with yellow putty **Note pt only currently scheduled through 7/27 currently due to unknown schedule with new job   [facility], OTR/L 01/30/2022, 9:43 AM

## 2022-01-30 ENCOUNTER — Encounter: Payer: Self-pay | Admitting: Occupational Therapy

## 2022-01-30 ENCOUNTER — Ambulatory Visit: Payer: 59 | Admitting: Occupational Therapy

## 2022-01-30 DIAGNOSIS — M25631 Stiffness of right wrist, not elsewhere classified: Secondary | ICD-10-CM

## 2022-01-30 DIAGNOSIS — M25531 Pain in right wrist: Secondary | ICD-10-CM | POA: Diagnosis not present

## 2022-01-30 DIAGNOSIS — R278 Other lack of coordination: Secondary | ICD-10-CM

## 2022-01-30 DIAGNOSIS — M79641 Pain in right hand: Secondary | ICD-10-CM

## 2022-01-30 DIAGNOSIS — R600 Localized edema: Secondary | ICD-10-CM

## 2022-01-30 DIAGNOSIS — M6281 Muscle weakness (generalized): Secondary | ICD-10-CM

## 2022-01-30 NOTE — Patient Instructions (Addendum)
   Wrist Flexion: Resisted   With right palm up, 1 pound weight in hand, bend wrist up. Return slowly. Repeat 10 times per set.  Do 2 sessions per day.  Wrist Extension: Resisted   With right palm down, 1 pound weight in hand, bend wrist up. Return slowly. Repeat 10 times per set. Do 2 sessions per day.   11. Grip Strengthening (Resistive Putty)   Squeeze putty using thumb and all fingers. Repeat 15 times. Do 1-2 sessions per day.   Extension (Assistive Putty)   Roll putty back and forth, being sure to use all fingertips. Repeat 3 times. Do 2 sessions per day.  Then pinch as below.   Palmar Pinch Strengthening (Resistive Putty)   Pinch putty between thumb and each fingertip in turn after rolling out

## 2022-01-31 NOTE — Therapy (Signed)
OUTPATIENT OCCUPATIONAL THERAPY ORTHO TREATMENT  Patient Name: LOLA CZERWONKA MRN: [id number] DOB:[birth year], 26 y.o., female Today's Date: 02/01/2022  PCP: Shelby Mattocks, DO REFERRING PROVIDER: Bonner Puna. Manson Passey   OT End of Session - 02/01/22 0930     Visit Number 5    Number of Visits 21    Date for OT Re-Evaluation 03/30/22    Authorization Type [employer]--visit limit 23    OT Start Time [phone]    OT Stop Time 0930    OT Time Calculation (min) 39 min    Activity Tolerance Patient tolerated treatment well    Behavior During Therapy WFL for tasks assessed/performed                 Past Medical History:  Diagnosis Date   Allergy    Depression    Self-mutilation    Sickle cell trait (HCC)    Past Surgical History:  Procedure Laterality Date   ANKLE FRACTURE SURGERY  2010   Bimalleolar fx repair, ORIF, fasciotiomy for compartment syndrome   Patient Active Problem List   Diagnosis Date Noted   Right wrist pain 01/16/2022   Migraines 11/27/2019   Screen for sexually transmitted diseases 09/30/2019   Bacterial vaginosis 09/30/2019   Depressed mood 09/30/2019   Screening for cervical cancer 04/03/2019   Head trauma 02/04/2016   Seasonal allergies 10/11/2014   Contraception management 10/11/2014   Anxiety and depression 04/12/2014   Mood disorder (HCC) 04/11/2014   HYPERHIDROSIS 03/05/2009   FRACTURE, ANKLE, RIGHT 03/05/2009   Dermatophytosis of scalp and beard 03/11/2007    ONSET DATE: MD visit 01/18/22, worse over last 1.5 wks (started years ago, but worsened over last 1.5 wks)  REFERRING DIAG: M25.531 (ICD-10-CM) - Right wrist pain R29.898 (ICD-10-CM) - Decreased grip strength of right hand   THERAPY DIAG:  Pain in right wrist  Pain in right hand  Muscle weakness (generalized)  Stiffness of right wrist, not elsewhere classified  Localized edema  Other lack of coordination  Rationale for Evaluation and Treatment Rehabilitation  SUBJECTIVE:    SUBJECTIVE STATEMENT: Had a good weekend--no pain   Pt accompanied by: self  PERTINENT HISTORY: Pt reports that she started noticing weakness x7 years.  Then intermittent pain over last 1.5 yrs, over the last 2-4 weeks it began to affect ADLs more.  Per MD notes:  Given location suspect that this is related to extensor digitorum tendon inflammation and x-ray noted slight ulnar minus variance.   PMH:   depression, migraines, anxiety.  Pt denies any medication allergies, hx of seizures, heart conditions/pacemaker.    PRECAUTIONS: None   PAIN:  Are you having pain? 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   AROM:  Isolated IP flex/ext, isolated MP flex/ext, straight fist, composite finger flexion, wrist flex, wrist ext, UD/RD--Pt returned demo each x10.  PROM wrist in flex for wrist/finger extensors stretch (elbow extended), PROM wrist ext stretch  Reviewed wrist strengthening with 1 lbs weight for : wrist flexion/ extension, ulnar/ radial  deviation, then yellow putty exercises for grip and tip pinch. Therapist verbally instructed pt in thumb flexion exercise with putty, pt returned demonstration. Ice pack x 4 mins       PATIENT EDUCATION: Education details:reviewed wrist flex/ext with 1lb wt., yellow putty HEP, added thumb flexion Person educated: Patient Education method: Explanation, Demonstration Education comprehension: verbalized understanding and returned  demonstration   HOME EXERCISE PROGRAM: 01/19/22:  wrist flex PROM (to stretch extensors; use of ice; basic activity modifications (issued tan foam grip for writing) 01/23/22:  AROM HEP 01/30/22:  wrist flex/ext with 1lb wt, yellow putty HEP  GOALS: Possible Goals reviewed with patient? Yes  SHORT TERM GOALS: Target date: 02/16/2022    Pt will be independent with initial HEP. Goal status: INITIAL  2.  Pt will demo at wrist ROM WNL with pain less than or equal to 2/10 Baseline:  5-6/10 Goal status: INITIAL  3.  Pt will use dominant RUE for light ADLs at least 90% of the time. Goal status: INITIAL  4.  Pt will verbalize understanding of adaptive strategies for ADLs/IADLs and exercise to decr pain and risk of injury. Goal status: INITIAL  5.  Pt will be independent with splint wear/care prn. Goal status: INITIAL   LONG TERM GOALS: Target date: 03/30/2022    Pt will be independent with updated HEP. Goal status: INITIAL  2.  Pt will improve Upper Extremity Functional Scale to at least 51/80 for improved dominant RUE functional use. Baseline: 31/80 Goal status: INITIAL  3.  Pt will report pain consistently less than 2/10 for ADLs/IADLs. Baseline: 3-10/10 Goal status: INITIAL  4.  Pt will improve R grip strength by at least 12lbs for ADLs. Baseline:  17.6lbs Goal status: INITIAL  5.  Pt will improve R 3 point pinch to at least 8lbs to assist with ADLs. Baseline: 1.5lbs Goal status: INITIAL    ASSESSMENT:  CLINICAL IMPRESSION: Pt is progressing with decr pain and incr ROM. She continues to demonstrate limited endurance.  PERFORMANCE DEFICITS in functional skills including ADLs, IADLs, coordination, dexterity, sensation, edema, ROM, strength, pain, FMC, decreased knowledge of precautions, decreased knowledge of use of DME, and UE functional use, cognitive skills including  and psychosocial skills including habits.   IMPAIRMENTS are limiting patient from ADLs, IADLs,  education, work, leisure, and social participation.   COMORBIDITIES has no other co-morbidities that affects occupational performance. Patient will benefit from skilled OT to address above impairments and improve overall function.  MODIFICATION OR ASSISTANCE TO COMPLETE EVALUATION: No modification of tasks or assist necessary to complete an evaluation.  OT OCCUPATIONAL PROFILE AND HISTORY: Problem focused assessment: Including review of records relating to presenting problem.  CLINICAL DECISION MAKING: LOW - limited treatment options, no task modification necessary  REHAB POTENTIAL: Good  EVALUATION COMPLEXITY: Low   PLAN: OT FREQUENCY: 2x/week  OT DURATION: 10 weeks +eval   PLANNED INTERVENTIONS: self care/ADL training, therapeutic exercise, therapeutic activity, neuromuscular re-education, manual therapy, passive range of motion, aquatic therapy, splinting, electrical stimulation, ultrasound, iontophoresis, paraffin, fluidotherapy, moist heat, cryotherapy, contrast bath, patient/family education, and DME and/or AE instructions  RECOMMENDED OTHER SERVICES: none at this time  CONSULTED AND AGREED WITH PLAN OF CARE: Patient  PLAN FOR NEXT SESSION:   fludio/ultrasound, activity modification prn,  progress HEP **Note pt only currently scheduled through 7/27 currently due to unknown schedule with new job   Laith Antonelli, OTR/L 02/01/2022, 11:57 AM

## 2022-02-01 ENCOUNTER — Ambulatory Visit: Payer: 59 | Admitting: Occupational Therapy

## 2022-02-01 DIAGNOSIS — M25531 Pain in right wrist: Secondary | ICD-10-CM | POA: Diagnosis not present

## 2022-02-01 DIAGNOSIS — R600 Localized edema: Secondary | ICD-10-CM

## 2022-02-01 DIAGNOSIS — M25631 Stiffness of right wrist, not elsewhere classified: Secondary | ICD-10-CM

## 2022-02-01 DIAGNOSIS — M79641 Pain in right hand: Secondary | ICD-10-CM

## 2022-02-01 DIAGNOSIS — M6281 Muscle weakness (generalized): Secondary | ICD-10-CM

## 2022-02-01 DIAGNOSIS — R278 Other lack of coordination: Secondary | ICD-10-CM

## 2022-02-06 ENCOUNTER — Ambulatory Visit: Payer: 59 | Admitting: Occupational Therapy

## 2022-02-06 DIAGNOSIS — M6281 Muscle weakness (generalized): Secondary | ICD-10-CM

## 2022-02-06 DIAGNOSIS — M25531 Pain in right wrist: Secondary | ICD-10-CM

## 2022-02-06 DIAGNOSIS — M79641 Pain in right hand: Secondary | ICD-10-CM

## 2022-02-06 DIAGNOSIS — R278 Other lack of coordination: Secondary | ICD-10-CM

## 2022-02-06 DIAGNOSIS — R600 Localized edema: Secondary | ICD-10-CM

## 2022-02-06 DIAGNOSIS — M25631 Stiffness of right wrist, not elsewhere classified: Secondary | ICD-10-CM

## 2022-02-06 NOTE — Therapy (Signed)
OUTPATIENT OCCUPATIONAL THERAPY ORTHO TREATMENT  Patient Name: Jo [name] MRN: [id number] DOB:September 30, 1995, 26 y.o., female Today's Date: 02/06/2022  PCP: Shelby Mattocks, DO REFERRING PROVIDER: Bonner Puna. Manson Passey   OT End of Session - 02/06/22 0948     Visit Number 6    Number of Visits 21    Date for OT Re-Evaluation 03/30/22    Authorization Type [employer]--visit limit 23    OT Start Time [phone]    OT Stop Time 1015    OT Time Calculation (min) 37 min    Activity Tolerance Patient tolerated treatment well                 Past Medical History:  Diagnosis Date   Allergy    Depression    Self-mutilation    Sickle cell trait (HCC)    Past Surgical History:  Procedure Laterality Date   ANKLE FRACTURE SURGERY  2010   Bimalleolar fx repair, ORIF, fasciotiomy for compartment syndrome   Patient Active Problem List   Diagnosis Date Noted   Right wrist pain 01/16/2022   Migraines 11/27/2019   Screen for sexually transmitted diseases 09/30/2019   Bacterial vaginosis 09/30/2019   Depressed mood 09/30/2019   Screening for cervical cancer 04/03/2019   Head trauma 02/04/2016   Seasonal allergies 10/11/2014   Contraception management 10/11/2014   Anxiety and depression 04/12/2014   Mood disorder (HCC) 04/11/2014   HYPERHIDROSIS 03/05/2009   FRACTURE, ANKLE, RIGHT 03/05/2009   Dermatophytosis of scalp and beard 03/11/2007    ONSET DATE: MD visit 01/18/22, worse over last 1.5 wks (started years ago, but worsened over last 1.5 wks)  REFERRING DIAG: M25.531 (ICD-10-CM) - Right wrist pain R29.898 (ICD-10-CM) - Decreased grip strength of right hand   THERAPY DIAG:  Pain in right wrist  Pain in right hand  Muscle weakness (generalized)  Stiffness of right wrist, not elsewhere classified  Localized edema  Other lack of coordination  Rationale for Evaluation and Treatment Rehabilitation  SUBJECTIVE:   SUBJECTIVE STATEMENT: Had a good weekend--no pain    Pt accompanied by: self  PERTINENT HISTORY: Pt reports that she started noticing weakness x7 years.  Then intermittent pain over last 1.5 yrs, over the last 2-4 weeks it began to affect ADLs more.  Per MD notes:  Given location suspect that this is related to extensor digitorum tendon inflammation and x-ray noted slight ulnar minus variance.   PMH:   depression, migraines, anxiety.  Pt denies any medication allergies, hx of seizures, heart conditions/pacemaker.    PRECAUTIONS: None   PAIN:  Are you having pain? 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   AROM:  tendon gliding exercises,  wrist flex, wrist ext,   PROM wrist in flex for wrist/finger extensors stretch (elbow extended), PROM wrist ext stretch  Ultrasound x to R wrist/hand dorsally  , 0.8wts/cm2, 20% pulsed with no adverse reactions for pain, edema, inflammation  Reviewed wrist strengthening upgraded with 2 lbs weight for : wrist flexion/ extension, ulnar/  radial deviation,   Graded clothespins for sustained pinch,1-8 # , 3 pt pinch,       PATIENT EDUCATION: Education details:reviewed wrist flex/ext with 1lb wt., yellow putty HEP, added thumb flexion Person educated: Patient Education method: Explanation, Demonstration Education comprehension: verbalized understanding and returned demonstration   HOME EXERCISE PROGRAM: 01/19/22:  wrist flex PROM (to stretch extensors; use of ice; basic activity  modifications (issued tan foam grip for writing) 01/23/22:  AROM HEP 01/30/22:  wrist flex/ext with 1lb wt, yellow putty HEP  GOALS: Possible Goals reviewed with patient? Yes  SHORT TERM GOALS: Target date: 02/16/2022    Pt will be independent with initial HEP. Goal status: INITIAL  2.  Pt will demo at wrist ROM WNL with pain less than or equal to 2/10 Baseline:  5-6/10 Goal status: INITIAL  3.  Pt will use dominant RUE for light ADLs at least 90% of the time. Goal status: INITIAL  4.  Pt will verbalize understanding of adaptive strategies for ADLs/IADLs and exercise to decr pain and risk of injury. Goal status: INITIAL  5.  Pt will be independent with splint wear/care prn. Goal status: INITIAL   LONG TERM GOALS: Target date: 03/30/2022    Pt will be independent with updated HEP. Goal status: INITIAL  2.  Pt will improve Upper Extremity Functional Scale to at least 51/80 for improved dominant RUE functional use. Baseline: 31/80 Goal status: INITIAL  3.  Pt will report pain consistently less than 2/10 for ADLs/IADLs. Baseline: 3-10/10 Goal status: INITIAL  4.  Pt will improve R grip strength by at least 12lbs for ADLs. Baseline:  17.6lbs Goal status: INITIAL  5.  Pt will improve R 3 point pinch to at least 8lbs to assist with ADLs. Baseline: 1.5lbs Goal status: INITIAL    ASSESSMENT:  CLINICAL IMPRESSION: Pt is progressing towards goals with improved strength and UE functional use.  PERFORMANCE DEFICITS in functional skills including ADLs, IADLs, coordination, dexterity, sensation, edema, ROM, strength, pain, FMC, decreased knowledge of precautions, decreased knowledge of use of DME, and UE functional use, cognitive skills including  and psychosocial skills including habits.   IMPAIRMENTS are limiting patient from ADLs, IADLs, education, work, leisure, and social participation.   COMORBIDITIES has no other co-morbidities that affects occupational performance.  Patient will benefit from skilled OT to address above impairments and improve overall function.  MODIFICATION OR ASSISTANCE TO COMPLETE EVALUATION: No modification of tasks or assist necessary to complete an evaluation.  OT OCCUPATIONAL PROFILE AND HISTORY: Problem focused assessment: Including review of records relating to presenting problem.  CLINICAL DECISION MAKING: LOW - limited treatment options, no task modification necessary  REHAB POTENTIAL: Good  EVALUATION COMPLEXITY: Low   PLAN: OT FREQUENCY: 2x/week  OT DURATION: 10 weeks +eval   PLANNED INTERVENTIONS: self care/ADL training, therapeutic exercise, therapeutic activity, neuromuscular re-education, manual therapy, passive range of motion, aquatic therapy, splinting, electrical stimulation, ultrasound, iontophoresis, paraffin, fluidotherapy, moist heat, cryotherapy, contrast bath, patient/family education, and DME and/or AE instructions  RECOMMENDED OTHER SERVICES: none at this time  CONSULTED AND AGREED WITH PLAN OF CARE: Patient  PLAN FOR NEXT SESSION:   fludio/ultrasound, activity modification prn,  progress HEP **Note pt only currently scheduled through 7/27 currently due to unknown schedule with new job   Cortlyn Cannell, OTR/L 02/06/2022, 3:13 PM

## 2022-02-09 ENCOUNTER — Ambulatory Visit: Payer: 59 | Admitting: Occupational Therapy

## 2022-02-09 DIAGNOSIS — M25531 Pain in right wrist: Secondary | ICD-10-CM

## 2022-02-09 DIAGNOSIS — M25631 Stiffness of right wrist, not elsewhere classified: Secondary | ICD-10-CM

## 2022-02-09 DIAGNOSIS — M6281 Muscle weakness (generalized): Secondary | ICD-10-CM

## 2022-02-09 DIAGNOSIS — M79641 Pain in right hand: Secondary | ICD-10-CM

## 2022-02-09 NOTE — Therapy (Signed)
OUTPATIENT OCCUPATIONAL THERAPY ORTHO TREATMENT  Patient Name: Jo [name] MRN: [id number] DOB:1995/08/09, 26 y.o., female Today's Date: 02/09/2022  PCP: Wells Guiles, DO REFERRING PROVIDER: Raiford Simmonds. Owens Shark   OT End of Session - 02/09/22 0903     Visit Number 7    Number of Visits 21    Date for OT Re-Evaluation 03/30/22    Authorization Type [employer]--visit limit 23    OT Start Time [phone]    OT Stop Time 0945    OT Time Calculation (min) 55 min    Activity Tolerance Patient tolerated treatment well    Behavior During Therapy WFL for tasks assessed/performed                 Past Medical History:  Diagnosis Date   Allergy    Depression    Self-mutilation    Sickle cell trait ()    Past Surgical History:  Procedure Laterality Date   ANKLE FRACTURE SURGERY  2010   Bimalleolar fx repair, ORIF, fasciotiomy for compartment syndrome   Patient Active Problem List   Diagnosis Date Noted   Right wrist pain 01/16/2022   Migraines 11/27/2019   Screen for sexually transmitted diseases 09/30/2019   Bacterial vaginosis 09/30/2019   Depressed mood 09/30/2019   Screening for cervical cancer 04/03/2019   Head trauma 02/04/2016   Seasonal allergies 10/11/2014   Contraception management 10/11/2014   Anxiety and depression 04/12/2014   Mood disorder ([place]) 04/11/2014   HYPERHIDROSIS 03/05/2009   FRACTURE, ANKLE, RIGHT 03/05/2009   Dermatophytosis of scalp and beard 03/11/2007    ONSET DATE: MD visit 01/18/22, worse over last 1.5 wks (started years ago, but worsened over last 1.5 wks)  REFERRING DIAG: M25.531 (ICD-10-CM) - Right wrist pain R29.898 (ICD-10-CM) - Decreased grip strength of right hand   THERAPY DIAG:  Pain in right wrist  Pain in right hand  Muscle weakness (generalized)  Stiffness of right wrist, not elsewhere classified  Rationale for Evaluation and Treatment Rehabilitation  SUBJECTIVE:   SUBJECTIVE STATEMENT: 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  Then intermittent pain over last 1.5 yrs, over the last 2-4 weeks it began to affect ADLs more.  Per MD notes:  Given location suspect that this is related to extensor digitorum tendon inflammation and x-ray noted slight ulnar minus variance.   PMH:   depression, migraines, anxiety.  Pt denies any medication allergies, hx of seizures, heart conditions/pacemaker.    PRECAUTIONS: None   PAIN:  Are you having pain? Yes: NPRS scale: 2/10 Pain location: wrist Pain description: aching Aggravating factors: overuse, wrist flexion movement Relieving factors: heat., ice        PLOF: Independent, Vocation/Vocational requirements: work with Government social research officer (drive, type, write), uses laptop riser, and Leisure: dancing, weightlifting, hikes, playing putt-putt , marketing/social media  PATIENT GOALS:  improve strength in R hand, improve pain, be able to do push up   OBJECTIVE:     TODAY'S TREATMENT:    Ultrasound x 58min to R wrist/hand dorsally  45mhz, 0.8wts/cm2, 20% pulsed with no adverse reactions for pain, edema, inflammation  Recommended pt ask PCP about referral to orthopedic hand specialist to r/o ligament sprains (? Scapholunate ligament involvement - pt has pain w/ wrist flexion at specific point, but also has pain/inflammation along extensor tendons of index and long fingers)  Discussed task modifications  further and A/E recommendations. Discussed positioning at work station and adjusting height of computer and/or chair.   Pt issued shoulder HEP for proximal strengthening as pt reports weakness in entire arm - see pt instructions for details      PATIENT EDUCATION: Education details: shoulder HEP Person educated: Patient Education method: Explanation, Demonstration, and Handouts Education comprehension: verbalized understanding  and returned demonstration   HOME EXERCISE PROGRAM: 01/19/22:  wrist flex PROM (to stretch extensors; use of ice; basic activity modifications (issued tan foam grip for writing) 01/23/22:  AROM HEP 01/30/22:  wrist flex/ext with 1lb wt, yellow putty HEP 02/09/22: Shoulder HEP    GOALS: Possible Goals reviewed with patient? Yes  SHORT TERM GOALS: Target date: 02/16/2022    Pt will be independent with initial HEP. Goal status: MET  2.  Pt will demo at wrist ROM WNL with pain less than or equal to 2/10 Baseline:  5-6/10 Goal status: MET  3.  Pt will use dominant RUE for light ADLs at least 90% of the time. Goal status: PARTIALLY MET (Pt sometimes switches to Lt when fatigued/pain, and not using Rt hand for heavier tasks)  4.  Pt will verbalize understanding of adaptive strategies for ADLs/IADLs and exercise to decr pain and risk of injury. Goal status: MET  5.  Pt will be independent with splint wear/care prn. Goal status: MET    LONG TERM GOALS: Target date: 03/30/2022    Pt will be independent with updated HEP. Goal status: INITIAL  2.  Pt will improve Upper Extremity Functional Scale to at least 51/80 for improved dominant RUE functional use. Baseline: 31/80 Goal status: INITIAL  3.  Pt will report pain consistently less than 2/10 for ADLs/IADLs. Baseline: 3-10/10 Goal status: INITIAL  4.  Pt will improve R grip strength by at least 12lbs for ADLs. Baseline:  17.6lbs Goal status: INITIAL  5.  Pt will improve R 3 point pinch to at least 8lbs to assist with ADLs. Baseline: 1.5lbs Goal status: INITIAL    ASSESSMENT:  CLINICAL IMPRESSION: Pt has met all STG's at this time. Pt is being placed on hold per pt request d/t starting new job and unsure of schedule  PERFORMANCE DEFICITS in functional skills including ADLs, IADLs, coordination, dexterity, sensation, edema, ROM, strength, pain, FMC, decreased knowledge of precautions, decreased knowledge of use of DME, and UE  functional use, cognitive skills including  and psychosocial skills including habits.   IMPAIRMENTS are limiting patient from ADLs, IADLs, education, work, leisure, and social participation.   COMORBIDITIES has no other co-morbidities that affects occupational performance. Patient will benefit from skilled OT to address above impairments and improve overall function.  MODIFICATION OR ASSISTANCE TO COMPLETE EVALUATION: No modification of tasks or assist necessary to complete an evaluation.  OT OCCUPATIONAL PROFILE AND HISTORY: Problem focused assessment: Including review of records relating to presenting problem.  CLINICAL DECISION MAKING: LOW - limited treatment options, no task modification necessary  REHAB POTENTIAL: Good  EVALUATION COMPLEXITY: Low   PLAN: OT FREQUENCY: 2x/week  OT DURATION: 10 weeks +eval   PLANNED INTERVENTIONS: self care/ADL training, therapeutic exercise, therapeutic activity, neuromuscular re-education, manual therapy, passive range of motion, aquatic therapy, splinting, electrical stimulation, ultrasound, iontophoresis, paraffin, fluidotherapy, moist heat, cryotherapy, contrast bath, patient/family education, and DME and/or AE instructions  RECOMMENDED OTHER SERVICES: none at this time  CONSULTED AND AGREED WITH PLAN OF CARE: Patient  PLAN FOR NEXT SESSION:   place on hold until pt starts job, pt informed that  she will be d/c after 30 days if she doesn't return. Pt reports living on [place] side of [place] and will be working in [employer] - pt informed of our [employer] location and possibly switching locations for easier commute/schedule.   Hans Eden, OTR/L 02/09/2022, 9:50 AM

## 2022-02-09 NOTE — Patient Instructions (Signed)
SHOULDER: Flexion Unilateral    Sit upright w/ good posture. Raise RT arm overhead holding 1 lb weight. Keep thumb pointed up. _10__ reps per set, _2__ sets per day  Scapular Retraction: Bilateral    Facing anchor, pull arms back, bringing shoulder blades together. Repeat __10__ times per set.  Do __2__ sessions per day, every other day.    Strengthening: Resisted Extension   Hold tubing in __Rt___ hand(s), arm forward. Pull arm back, elbow straight. Repeat _10___ times per set. Do _2___ sessions per day, every other day.

## 2022-02-12 ENCOUNTER — Other Ambulatory Visit: Payer: Self-pay | Admitting: Family Medicine

## 2022-02-14 ENCOUNTER — Encounter: Payer: 59 | Admitting: Occupational Therapy

## 2022-02-16 ENCOUNTER — Encounter: Payer: 59 | Admitting: Occupational Therapy

## 2022-02-20 ENCOUNTER — Encounter: Payer: 59 | Admitting: Occupational Therapy

## 2022-02-22 ENCOUNTER — Encounter: Payer: 59 | Admitting: Occupational Therapy

## 2022-03-03 ENCOUNTER — Other Ambulatory Visit (HOSPITAL_COMMUNITY)
Admission: RE | Admit: 2022-03-03 | Discharge: 2022-03-03 | Disposition: A | Discharge status: Home / Self Care | Payer: 59 | Source: Ambulatory Visit | Attending: Family Medicine | Admitting: Family Medicine

## 2022-03-03 ENCOUNTER — Encounter: Payer: Self-pay | Admitting: Student

## 2022-03-03 ENCOUNTER — Ambulatory Visit (INDEPENDENT_AMBULATORY_CARE_PROVIDER_SITE_OTHER): Payer: 59 | Admitting: Student

## 2022-03-03 VITALS — BP 110/70 | HR 100 | Ht 64.0 in | Wt 233.0 lb

## 2022-03-03 DIAGNOSIS — Z23 Encounter for immunization: Secondary | ICD-10-CM

## 2022-03-03 DIAGNOSIS — R4589 Other symptoms and signs involving emotional state: Secondary | ICD-10-CM

## 2022-03-03 DIAGNOSIS — Z0000 Encounter for general adult medical examination without abnormal findings: Secondary | ICD-10-CM | POA: Insufficient documentation

## 2022-03-03 DIAGNOSIS — Z113 Encounter for screening for infections with a predominantly sexual mode of transmission: Secondary | ICD-10-CM

## 2022-03-03 DIAGNOSIS — R5383 Other fatigue: Secondary | ICD-10-CM | POA: Diagnosis not present

## 2022-03-03 NOTE — Assessment & Plan Note (Signed)
Broad ddx although reassuringly hemodynamically stable with no systemic symptoms. Continue with lab work today including BMP, CBC, TSH, vitamin D Instructed patient to continue with journal of her sleep habits and follow up 2 weeks, concerned about migraines as well and will discuss this at that time

## 2022-03-03 NOTE — Progress Notes (Signed)
  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   Fatigue:  Fatigue started:  several years ago The Tiredness is described as constant  Feels like doing activities but can't: stay awake throughout the day, requires a nap regularly Feels the fatigue is due to: Unsure of cause   Symptoms Fever: no Sweating at night: no Weight Loss: no Shortness of Breath: no Coughing up Blood: no Muscle Pain or Weakness: no Black or bloody Stools: no Severe Snoring or Daytime Sleepiness: yes, both Feeling Down: no Not enjoying things: no Rash: no Leg or Joint Swelling: no Chest Pain or irregular heart beat:  no     HC maintenance  Needs pap in September 2023 Needs Hep C  Needs TDAP   PERTINENT  PMH / PSH:   Past Medical History:  Diagnosis Date   Allergy    Depression    Self-mutilation    Sickle cell trait (HCC)     OBJECTIVE:  BP 110/70   Pulse 100   Ht 5\' 4"  (1.626 m)   Wt 233 lb (105.7 kg)   SpO2 98%   BMI 39.99 kg/m   General: NAD, pleasant, able to participate in exam Cardiac: RRR, no murmurs auscultated Respiratory: CTAB, normal WOB Abdomen: soft, non-tender, non-distended, normoactive bowel sounds Extremities: warm and well perfused, no edema or cyanosis Skin: warm and dry, no rashes noted GU: Normal vaginal vault with mild, scant white drainage, no ulcerations or lesions  Neuro: alert, no obvious focal deficits, speech normal Psych: Normal affect and mood  ASSESSMENT/PLAN:  Depressed mood    03/03/2022   11:12 AM 11/25/2019    1:49 PM 10/16/2019    6:25 PM  PHQ9 SCORE ONLY  PHQ-9 Total Score 12 9 27    Although elevated, patient reports that she feels stable and mood is good today. Does not feel this is related to her fatigue. Not on medications   Screen for sexually transmitted diseases Reviewed labs and allergies, will check GC/Chlamydia,  Trichomonas, RPR, HIV and HepC and will call patient with results. Discussed safe sex practices. Continued with pap as well. Patient's questions answered to their satisfaction.   Other fatigue Broad ddx although reassuringly hemodynamically stable with no systemic symptoms. Continue with lab work today including BMP, CBC, TSH, vitamin D Instructed patient to continue with journal of her sleep habits and follow up 2 weeks, concerned about migraines as well and will discuss this at that time   Updating TDAP and obtaining quant gold for work.     Orders Placed This Encounter  Procedures   Tdap vaccine greater than or equal to 7yo IM   Basic Metabolic Panel   CBC   TSH Rfx on Abnormal to Free T4   Hepatitis C Antibody   QuantiFERON-TB Gold Plus   HIV antibody (with reflex)   RPR   Vitamin D, 25-hydroxy   No orders of the defined types were placed in this encounter.  Return in about 2 weeks (around 03/17/2022) for fatigue and migraines . , MD 03/03/2022, 1:14 PM PGY-2, [facility]

## 2022-03-03 NOTE — Patient Instructions (Addendum)
It was great to see you today! Thank you for choosing [facility] for your primary care. Jo [name] was seen for follow up.  Today we addressed: Continue with lab work and vaccines as needed Keep a journal of your fatigue and migraines and follow up in 2 weeks   If you haven't already, sign up for My Chart to have easy access to your labs results, and communication with your primary care physician.  We are checking some labs today. If they are abnormal, I will call you. If they are normal, I will send you a MyChart message (if it is active) or a letter in the mail. If you do not hear about your labs in the next 2 weeks, please call the office. I recommend that you always bring your medications to each appointment as this makes it easy to ensure you are on the correct medications and helps [place] not miss refills when you need them. Call the clinic at [phone] if your symptoms worsen or you have any concerns.  You should return to our clinic Return in about 2 weeks (around 03/17/2022) for fatigue and migraines . Please arrive 15 minutes before your appointment to ensure smooth check in process.  We appreciate your efforts in making this happen.  Thank you for allowing me to participate in your care, Alfredo Martinez, MD 03/03/2022, 11:51 AM PGY-2, [facility]

## 2022-03-03 NOTE — Assessment & Plan Note (Signed)
Reviewed labs and allergies, will check GC/Chlamydia, Trichomonas, RPR, HIV and HepC and will call patient with results. Discussed safe sex practices. Continued with pap as well. Patient's questions answered to their satisfaction.

## 2022-03-03 NOTE — Assessment & Plan Note (Signed)
    03/03/2022   11:12 AM 11/25/2019    1:49 PM 10/16/2019    6:25 PM  PHQ9 SCORE ONLY  PHQ-9 Total Score 12 9 27    Although elevated, patient reports that she feels stable and mood is good today. Does not feel this is related to her fatigue. Not on medications

## 2022-03-03 NOTE — Assessment & Plan Note (Signed)
>>  ASSESSMENT AND PLAN FOR DEPRESSED MOOD WRITTEN ON 03/03/2022  1:12 PM BY Alfredo Martinez, MD     03/03/2022   11:12 AM 11/25/2019    1:49 PM 10/16/2019    6:25 PM  PHQ9 SCORE ONLY  PHQ-9 Total Score 12 9 27    Although elevated, patient reports that she feels stable and mood is good today. Does not feel this is related to her fatigue. Not on medications

## 2022-03-06 LAB — QUANTIFERON-TB GOLD PLUS
QuantiFERON Mitogen Value: >10 IU/mL
QuantiFERON Nil Value: 0.05 IU/mL
QuantiFERON TB1 Ag Value: 0.06 IU/mL
QuantiFERON TB2 Ag Value: 0.06 IU/mL
QuantiFERON-TB Gold Plus: NEGATIVE

## 2022-03-06 LAB — CBC
Hematocrit: 37.7 % (ref 34.0–46.6)
Hemoglobin: 12.8 g/dL (ref 11.1–15.9)
MCH: 29.1 pg (ref 26.6–33.0)
MCHC: 34 g/dL (ref 31.5–35.7)
MCV: 86 fL (ref 79–97)
Platelets: 283 10*3/uL (ref 150–450)
RBC: 4.4 x10E6/uL (ref 3.77–5.28)
RDW: 13.2 % (ref 11.7–15.4)
WBC: 7.5 10*3/uL (ref 3.4–10.8)

## 2022-03-06 LAB — VITAMIN D 25 HYDROXY (VIT D DEFICIENCY, FRACTURES): Vit D, 25-Hydroxy: 21.6 ng/mL — ABNORMAL LOW (ref 30.0–100.0)

## 2022-03-06 LAB — BASIC METABOLIC PANEL
BUN/Creatinine Ratio: 14 (ref 9–23)
BUN: 8 mg/dL (ref 6–20)
CO2: 20 mmol/L (ref 20–29)
Calcium: 9.6 mg/dL (ref 8.7–10.2)
Chloride: 106 mmol/L (ref 96–106)
Creatinine, Ser: 0.56 mg/dL — ABNORMAL LOW (ref 0.57–1.00)
Glucose: 100 mg/dL — ABNORMAL HIGH (ref 70–99)
Potassium: 4.4 mmol/L (ref 3.5–5.2)
Sodium: 140 mmol/L (ref 134–144)
eGFR: 129 mL/min/{1.73_m2} (ref >59)

## 2022-03-06 LAB — HEPATITIS C ANTIBODY: Hep C Virus Ab: NONREACTIVE

## 2022-03-06 LAB — RPR: RPR Ser Ql: NONREACTIVE

## 2022-03-06 LAB — TSH RFX ON ABNORMAL TO FREE T4: TSH: 2.5 u[IU]/mL (ref 0.450–4.500)

## 2022-03-06 LAB — HIV ANTIBODY (ROUTINE TESTING W REFLEX): HIV Screen 4th Generation wRfx: NONREACTIVE

## 2022-03-07 ENCOUNTER — Telehealth: Payer: Self-pay | Admitting: Student

## 2022-03-07 ENCOUNTER — Encounter: Payer: Self-pay | Admitting: Student

## 2022-03-07 NOTE — Telephone Encounter (Signed)
Patient dropped off form at front desk for Health Examination  Certificate.  Verified that patient section of form has been completed.  Last DOS/WCC with PCP was 03/03/22.  Placed form in green team folder to be completed by clinical staff.  Jo [name]

## 2022-03-08 LAB — CYTOLOGY - PAP
Adequacy: ABSENT
Chlamydia: NEGATIVE
Comment: NEGATIVE
Comment: NEGATIVE
Comment: NORMAL
Diagnosis: NEGATIVE
Neisseria Gonorrhea: NEGATIVE
Trichomonas: NEGATIVE

## 2022-03-10 NOTE — Telephone Encounter (Signed)
Called patient to fill in missing information for paperwork.   Completed and emailed to [email or web address]  Veronda Prude, RN

## 2022-03-10 NOTE — Telephone Encounter (Signed)
LVM asking patient if form has been filled out. Spoke to Dr. Royal Piedra. He confirmed he filled out the form on Wednesday and put in the nurse box. Sunday Spillers, CMA

## 2022-09-04 ENCOUNTER — Ambulatory Visit (INDEPENDENT_AMBULATORY_CARE_PROVIDER_SITE_OTHER): Payer: Medicaid Other | Admitting: Family Medicine

## 2022-09-04 ENCOUNTER — Encounter: Payer: Self-pay | Admitting: Family Medicine

## 2022-09-04 VITALS — BP 125/77 | HR 75 | Ht 63.0 in | Wt 235.0 lb

## 2022-09-04 DIAGNOSIS — F32A Depression, unspecified: Secondary | ICD-10-CM

## 2022-09-04 DIAGNOSIS — F419 Anxiety disorder, unspecified: Secondary | ICD-10-CM

## 2022-09-04 DIAGNOSIS — Z3009 Encounter for other general counseling and advice on contraception: Secondary | ICD-10-CM | POA: Diagnosis not present

## 2022-09-04 LAB — POCT URINE PREGNANCY: Preg Test, Ur: NEGATIVE

## 2022-09-04 MED ORDER — CITALOPRAM HYDROBROMIDE 10 MG PO TABS: 10.0000 mg | ORAL_TABLET | Freq: Every day | ORAL | 1 refills | Status: DC

## 2022-09-04 NOTE — Patient Instructions (Addendum)
It was great seeing you today!  Today we discussed your mood, let's start celexa 10 mg daily. I have sent this prescription to your pharmacy. If you feel we need to add on more medication then we can consider that at a later time.   Next month you will be due for a nexplanon replacement.  Please follow up at your next scheduled appointment on 2/26 at 3:10 pm, if anything arises between now and then, please don't hesitate to contact our office.   Thank you for allowing [place] to be a part of your medical care!  Thank you, Dr. Larae Grooms  Also a reminder of our clinic's no-show policy. Please make sure to arrive at least 15 minutes prior to your scheduled appointment time. Please try to cancel before 24 hours if you are not able to make it. If you no-show for 2 appointments then you will be receiving a warning letter. If you no-show after 3 visits, then you may be at risk of being dismissed from our clinic. This is to ensure that everyone is able to be seen in a timely manner. Thank you, we appreciate your assistance with this!

## 2022-09-04 NOTE — Progress Notes (Signed)
    SUBJECTIVE:   CHIEF COMPLAINT / HPI:   Patient presents for anxiety and depression, she was previously on a medication regimen but felt that she did not need this a few years ago. She has been having a rough time a work where she had a mental breakdown in her car. She was able to discuss this with her workplace and was able to get a week off. She had SI at the time but did not have a plan, this was about 3 weeks ago. Denies current SI or plan. History of suicide attempt where she attempted to overdose in college, the school called EMS and they took her to the hospital Since then, she has not had a plan or ever attempted. Support system includes her sister, fiance and a few close friends. She reports that she would never harm herself because of her sister and all that she has been through herself.  Prior regimen was wellbutrin, celexa and hydroxyzine a few years ago and has not been on any of these for the past 2 years. She felt that she was doing a lot better and life was very food which is why she did not continue them. She has never been diagnosed with bipolar disorder and feels that she denies experiencing highs or lows, reports that no one has expressed noticing this either. Works within the [employer] and gets therapy with them. She is able to meet with them regularly and is the process of scheduling.   Also had nexplanon placed in March 2021 and wants it replaced. She feels that it works well for her and does not wish to switch to a different form of birth control at this time.   OBJECTIVE:   BP 125/77   Pulse 75   Ht 5' 3"$  (1.6 m)   Wt 235 lb (106.6 kg)   SpO2 99%   BMI 41.63 kg/m   General: Patient well-appearing, in no acute distress.  CV: RRR, no murmurs or gallops auscultated Resp: CTAB, no wheezing, rales or rhonchi noted Psych: mood appropriate, not anxious appearing, denies SI or plan   ASSESSMENT/PLAN:   Anxiety and depression -PHQ-9 score of 17 with 1  for question 9 reviewed and discussed, patient confirms that she last had SI 3 weeks ago and has not had a plan in years -GAD-7 score of 15 reviewed and discussed.  -starting celexa 10 mg daily -continue therapy, encouraged that both therapy and medication provide optimal benefit  -follow up in 1 week for mood check, consider adding wellbutrin in a month if no improvement  Contraception management -contraception counseling provided -patient would like to have nexplanon replaced, will schedule appointment for nexplanon removal and insertion at next visit     Donney Dice, [place]

## 2022-09-04 NOTE — Assessment & Plan Note (Signed)
-  PHQ-9 score of 17 with 1 for question 9 reviewed and discussed, patient confirms that she last had SI 3 weeks ago and has not had a plan in years -GAD-7 score of 15 reviewed and discussed.  -starting celexa 10 mg daily -continue therapy, encouraged that both therapy and medication provide optimal benefit  -follow up in 1 week for mood check, consider adding wellbutrin in a month if no improvement

## 2022-09-04 NOTE — Assessment & Plan Note (Signed)
-  contraception counseling provided -patient would like to have nexplanon replaced, will schedule appointment for nexplanon removal and insertion at next visit

## 2022-09-11 ENCOUNTER — Ambulatory Visit (INDEPENDENT_AMBULATORY_CARE_PROVIDER_SITE_OTHER): Payer: Medicaid Other | Admitting: Family Medicine

## 2022-09-11 ENCOUNTER — Encounter: Payer: Self-pay | Admitting: Family Medicine

## 2022-09-11 VITALS — BP 125/80 | HR 69 | Ht 63.0 in | Wt 235.0 lb

## 2022-09-11 DIAGNOSIS — F419 Anxiety disorder, unspecified: Secondary | ICD-10-CM | POA: Diagnosis not present

## 2022-09-11 DIAGNOSIS — F32A Depression, unspecified: Secondary | ICD-10-CM

## 2022-09-11 NOTE — Assessment & Plan Note (Signed)
-  patient doing better with pharmacotherapy which she also agrees with. Congratulated patient on her progress, she seems to be very intrinsically motivated which has clearly benefited her -tolerating celexa well, continue 10 mg daily -discussed coping strategies and reassurance provided  -PHQ-9 score of 12 with negative question 9 reviewed and discussed. Patient initially placed 2 for question 9 but denies any thoughts of harming herself for more than 3 weeks -follow up on 1 month for mood check, may consider increasing celexa dose if appropriate

## 2022-09-11 NOTE — Progress Notes (Signed)
    SUBJECTIVE:   CHIEF COMPLAINT / HPI:   Patient presents for mood follow up. Recently started celexa 10 mg daily, reports compliance. Today she feels better. She feels that life is going well. Denies having a plan since freshman year of college. Last she had thoughts of harming herself was 2/5. Tolerating medication well and wants to continue on medication. Support system includes her fiance, sister and friends. She tells me she has a pretty good support system and lots of people she can talk to if she needs to. Work is her biggest stressor so she is looking into switching to another job, she says that celexa has helped her cope with this. Feels like she is handling things in her life so well and has been more organized with things such as laundry which she hadn't in the past. She is stressing less about everyday tasks and feels more relaxed about things. Able to find time to workout.   OBJECTIVE:   BP 125/80   Pulse 69   Ht 5' 3"$  (1.6 m)   Wt 235 lb (106.6 kg)   LMP 08/24/2022   SpO2 99%   BMI 41.63 kg/m   General: Patient well-appearing and smiling, in no acute distress.  Resp: normal work of breathing noted Psych: mood appropriate, very pleasant, denies SI or plan   ASSESSMENT/PLAN:   Anxiety and depression -patient doing better with pharmacotherapy which she also agrees with. Congratulated patient on her progress, she seems to be very intrinsically motivated which has clearly benefited her -tolerating celexa well, continue 10 mg daily -discussed coping strategies and reassurance provided  -PHQ-9 score of 12 with negative question 9 reviewed and discussed. Patient initially placed 2 for question 9 but denies any thoughts of harming herself for more than 3 weeks -follow up on 1 month for mood check, may consider increasing celexa dose if appropriate     Bronte Sabado Larae Grooms, [place]

## 2022-09-11 NOTE — Patient Instructions (Addendum)
It was great seeing you today!  Today we discussed your mood, I am so proud of all the progress that you have made and how great you are doing! Please continue taking the celexa 10 mg daily.   Please follow up at your next scheduled appointment on 3/25 at 3:50 pm, if anything arises between now and then, please don't hesitate to contact our office.   Thank you for allowing [place] to be a part of your medical care!  Thank you, Dr. Larae Grooms  Also a reminder of our clinic's no-show policy. Please make sure to arrive at least 15 minutes prior to your scheduled appointment time. Please try to cancel before 24 hours if you are not able to make it. If you no-show for 2 appointments then you will be receiving a warning letter. If you no-show after 3 visits, then you may be at risk of being dismissed from our clinic. This is to ensure that everyone is able to be seen in a timely manner. Thank you, we appreciate your assistance with this!

## 2022-10-09 ENCOUNTER — Encounter: Payer: Self-pay | Admitting: Family Medicine

## 2022-10-09 ENCOUNTER — Ambulatory Visit: Payer: Medicaid Other | Admitting: Family Medicine

## 2022-10-09 VITALS — BP 121/86 | HR 86 | Ht 63.0 in | Wt 235.0 lb

## 2022-10-09 DIAGNOSIS — F32A Depression, unspecified: Secondary | ICD-10-CM | POA: Diagnosis not present

## 2022-10-09 DIAGNOSIS — F419 Anxiety disorder, unspecified: Secondary | ICD-10-CM | POA: Diagnosis not present

## 2022-10-09 NOTE — Assessment & Plan Note (Signed)
-  due for nexplanon replacement, scheduled nexplanon removal and replacement on 4/15 with PCP

## 2022-10-09 NOTE — Progress Notes (Signed)
    SUBJECTIVE:   CHIEF COMPLAINT / HPI:   Patient presets for mood follow up. Reports that she has been feeling very well. She feels like sometimes she has irritation and mood fluctuation but notes that this is from her period. She realizes that she does not have a reason to be annoyed and makes herself free to feel how she wants which has helped her. Taking celexa in the mornings daily, maintains compliance. She feels calm all the time. 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   OBJECTIVE:   BP 121/86   Pulse 86   Ht 5\' 3"  (1.6 m)   Wt 235 lb (106.6 kg)   LMP 09/28/2022   SpO2 98%   BMI 41.63 kg/m   General: Patient well-appearing, in no acute distress. Resp: normal work of breathing noted Ext: nexplanon in place in left UE Psych: mood appropriate, denies SI and plan   ASSESSMENT/PLAN:   Anxiety and depression -PHQ-9 score of 13 with 1 for question 9 reviewed and discussed but patient emphasizes that she continues to not have a plan and has these thoughts much less frequently compared to prior. -continues to improve, reassurance provided and seems to be doing very well on current pharmacotherapy. -continue celexa 10 mg daily -coping techniques reiterated  -follow up as appropriate   Contraception management -due for nexplanon replacement, scheduled nexplanon removal and replacement on 4/15 with PCP    Melana Hingle Larae Grooms, [place]

## 2022-10-09 NOTE — Assessment & Plan Note (Addendum)
-  PHQ-9 score of 13 with 1 for question 9 reviewed and discussed but patient emphasizes that she continues to not have a plan and has these thoughts much less frequently compared to prior. -continues to improve, reassurance provided and seems to be doing very well on current pharmacotherapy. -continue celexa 10 mg daily -coping techniques reiterated  -follow up as appropriate

## 2022-10-09 NOTE — Patient Instructions (Signed)
It was great seeing you today!  Today we discussed your mood, I am so glad that this has continued to improve. Please continue to take celexa 10 mg daily.   Please follow up at your next scheduled appointment on 4/15 at 3:30 pm for your nexplanon removal and reinsertion, if anything arises between now and then, please don't hesitate to contact our office.   Thank you for allowing [place] to be a part of your medical care!  Thank you, Dr. Larae Grooms  Also a reminder of our clinic's no-show policy. Please make sure to arrive at least 15 minutes prior to your scheduled appointment time. Please try to cancel before 24 hours if you are not able to make it. If you no-show for 2 appointments then you will be receiving a warning letter. If you no-show after 3 visits, then you may be at risk of being dismissed from our clinic. This is to ensure that everyone is able to be seen in a timely manner. Thank you, we appreciate your assistance with this!

## 2022-10-30 ENCOUNTER — Ambulatory Visit: Payer: Medicaid Other | Admitting: Student

## 2022-10-30 NOTE — Progress Notes (Deleted)
GYNECOLOGY PROCEDURE NOTE  Jo [name] is a 27 y.o. No obstetric history on file. here for Nexplanon removal and insertion. No other gynecologic concerns. ADD NEXPLANON TO PROBLEM LIST AND REMOVAL DATE IN OVERVIEW***  Nexplanon Removal and Insertion  Patient identified, informed consent performed, consent signed.   Patient does understand that irregular bleeding is a very common side effect of this medication. She was advised to have backup contraception for one week after replacement of the implant. Pregnancy test in clinic today was negative.  Appropriate time out taken. Implanon site identified. Area prepped in usual sterile fashon. One ml of 1% lidocaine was used to anesthetize the area at the distal end of the implant. A small stab incision was made right beside the implant on the distal portion. The Nexplanon rod was grasped using hemostats and removed without difficulty. There was minimal blood loss. There were no complications. Area was then injected with 3 ml of 1 % lidocaine. She was re-prepped with betadine, Nexplanon removed from packaging, Device confirmed in needle, then inserted full length of needle and withdrawn per handbook instructions. Nexplanon was able to palpated in the patient's arm; patient palpated the insert herself.  There was minimal blood loss. Patient insertion site covered with guaze and a pressure bandage to reduce any bruising. The patient tolerated the procedure well and was given post procedure instructions.  She was advised to have backup contraception for one week.    Shelby Mattocks, DO 10/30/2022, 1:19 PM PGY-2, [facility]

## 2023-03-08 ENCOUNTER — Other Ambulatory Visit: Payer: Self-pay

## 2023-03-08 DIAGNOSIS — F32A Depression, unspecified: Secondary | ICD-10-CM

## 2023-03-08 MED ORDER — CITALOPRAM HYDROBROMIDE 10 MG PO TABS
10.0000 mg | ORAL_TABLET | Freq: Every day | ORAL | 1 refills | Status: DC
Start: 2023-03-08 — End: 2023-05-09

## 2023-04-05 ENCOUNTER — Other Ambulatory Visit: Payer: Self-pay

## 2023-04-05 ENCOUNTER — Emergency Department (HOSPITAL_BASED_OUTPATIENT_CLINIC_OR_DEPARTMENT_OTHER)
Admission: EM | Admit: 2023-04-05 | Discharge: 2023-04-05 | Disposition: A | Discharge status: Home / Self Care | Payer: Medicaid Other | Attending: Emergency Medicine | Admitting: Emergency Medicine

## 2023-04-05 DIAGNOSIS — R42 Dizziness and giddiness: Secondary | ICD-10-CM | POA: Diagnosis not present

## 2023-04-05 LAB — CBC
HCT: 38.2 % (ref 36.0–46.0)
Hemoglobin: 12.8 g/dL (ref 12.0–15.0)
MCH: 28.9 pg (ref 26.0–34.0)
MCHC: 33.5 g/dL (ref 30.0–36.0)
MCV: 86.2 fL (ref 80.0–100.0)
Platelets: 316 10*3/uL (ref 150–400)
RBC: 4.43 MIL/uL (ref 3.87–5.11)
RDW: 14.3 % (ref 11.5–15.5)
WBC: 8.5 10*3/uL (ref 4.0–10.5)
nRBC: 0 % (ref 0.0–0.2)

## 2023-04-05 LAB — URINALYSIS, ROUTINE W REFLEX MICROSCOPIC
Bilirubin Urine: NEGATIVE
Glucose, UA: NEGATIVE mg/dL
Hgb urine dipstick: NEGATIVE
Ketones, ur: NEGATIVE mg/dL
Leukocytes,Ua: NEGATIVE
Nitrite: NEGATIVE
Protein, ur: NEGATIVE mg/dL
Specific Gravity, Urine: >=1.03 (ref 1.005–1.030)
pH: 6 (ref 5.0–8.0)

## 2023-04-05 LAB — BASIC METABOLIC PANEL
Anion gap: 11 (ref 5–15)
BUN: 10 mg/dL (ref 6–20)
CO2: 22 mmol/L (ref 22–32)
Calcium: 9.4 mg/dL (ref 8.9–10.3)
Chloride: 104 mmol/L (ref 98–111)
Creatinine, Ser: 0.57 mg/dL (ref 0.44–1.00)
GFR, Estimated: >60 mL/min (ref >60)
Glucose, Bld: 125 mg/dL — ABNORMAL HIGH (ref 70–99)
Potassium: 3.6 mmol/L (ref 3.5–5.1)
Sodium: 137 mmol/L (ref 135–145)

## 2023-04-05 LAB — CBG MONITORING, ED: Glucose-Capillary: 115 mg/dL — ABNORMAL HIGH (ref 70–99)

## 2023-04-05 LAB — PREGNANCY, URINE: Preg Test, Ur: NEGATIVE

## 2023-04-05 MED ORDER — MECLIZINE HCL 25 MG PO TABS
50.0000 mg | ORAL_TABLET | Freq: Once | ORAL | Status: AC
Start: 2023-04-05 — End: 2023-04-05
  Administered 2023-04-05: 50 mg via ORAL
  Filled 2023-04-05: qty 2

## 2023-04-05 MED ORDER — MECLIZINE HCL 25 MG PO TABS: 25.0000 mg | ORAL_TABLET | Freq: Three times a day (TID) | ORAL | 0 refills | Status: DC | PRN

## 2023-04-05 NOTE — ED Provider Notes (Signed)
[place] [facility] AT [facility] Provider Note   CSN: [id number] Arrival date & time: 04/05/23  1509     History  Chief Complaint  Patient presents with   Dizziness    Jo [name] is a 27 y.o. female.  Patient to ED for evaluation of progressively worsening positional dizziness like her surroundings move. No nausea or vomiting. No fever. She reports history of allergies with an episode of nasal congestion and a mild cough about a week ago without fever. No history of similar symptoms in the past. She reports feeling dizzy when walking, has bumped into walls but cannot identify favoring the right or left. No falls. No syncope or near syncope. No symptoms when lying down.   The history is provided by the patient. No language interpreter was used.  Dizziness      Home Medications Prior to Admission medications   Medication Sig Start Date End Date Taking? Authorizing Provider  meclizine (ANTIVERT) 25 MG tablet Take 1 tablet (25 mg total) by mouth 3 (three) times daily as needed for dizziness. 04/05/23  Yes Lynett Brasil, Melvenia Beam, PA-C  citalopram (CELEXA) 10 MG tablet Take 1 tablet (10 mg total) by mouth daily. 03/08/23   Shelby Mattocks, DO      Allergies    Apple juice, Carrot [daucus carota], Strawberry extract, Tomato, Banana, and Bee venom    Review of Systems   Review of Systems  Neurological:  Positive for dizziness.    Physical Exam Updated Vital Signs BP 126/81   Pulse 75   Temp 98.6 F (37 C) (Oral)   Resp 17   Ht 5\' 3"  (1.6 m)   Wt 110.2 kg   LMP 03/17/2023   SpO2 97%   BMI 43.05 kg/m  Physical Exam Vitals and nursing note reviewed.  Constitutional:      Appearance: Normal appearance.  HENT:     Head: Normocephalic and atraumatic.     Right Ear: Tympanic membrane and ear canal normal.     Left Ear: Tympanic membrane and ear canal normal.     Nose: Nose normal.     Mouth/Throat:     Mouth: Mucous membranes are moist.  Eyes:      Extraocular Movements: Extraocular movements intact.     Pupils: Pupils are equal, round, and reactive to light.  Neck:     Vascular: No carotid bruit.  Cardiovascular:     Rate and Rhythm: Normal rate and regular rhythm.     Pulses: Normal pulses.     Heart sounds: No murmur heard. Pulmonary:     Effort: Pulmonary effort is normal.     Breath sounds: No wheezing, rhonchi or rales.  Abdominal:     General: There is no distension.     Palpations: Abdomen is soft.     Tenderness: There is no abdominal tenderness.  Musculoskeletal:        General: Normal range of motion.     Cervical back: Normal range of motion and neck supple.  Skin:    General: Skin is warm and dry.  Neurological:     Mental Status: She is alert and oriented to person, place, and time.     GCS: GCS eye subscore is 4. GCS verbal subscore is 5. GCS motor subscore is 6.     Cranial Nerves: Cranial nerves 2-12 are intact. No dysarthria.     Sensory: No sensory deficit.     Motor: No weakness, abnormal muscle tone or pronator  drift.     Coordination: Coordination is intact. Finger-Nose-Finger Test normal.     Gait: Gait is intact.     Deep Tendon Reflexes:     Reflex Scores:      Patellar reflexes are 2+ on the right side and 2+ on the left side.    Comments: Unidirectional leftward nystagmus      ED Results / Procedures / Treatments   Labs (all labs ordered are listed, but only abnormal results are displayed) Labs Reviewed  BASIC METABOLIC PANEL - Abnormal; Notable for the following components:      Result Value   Glucose, Bld 125 (*)    All other components within normal limits  CBG MONITORING, ED - Abnormal; Notable for the following components:   Glucose-Capillary 115 (*)    All other components within normal limits  CBC  URINALYSIS, ROUTINE W REFLEX MICROSCOPIC  PREGNANCY, URINE   Results for orders placed or performed during the hospital encounter of 04/05/23  Basic metabolic panel  Result Value  Ref Range   Sodium 137 135 - 145 mmol/L   Potassium 3.6 3.5 - 5.1 mmol/L   Chloride 104 98 - 111 mmol/L   CO2 22 22 - 32 mmol/L   Glucose, Bld 125 (H) 70 - 99 mg/dL   BUN 10 6 - 20 mg/dL   Creatinine, Ser 9.60 0.44 - 1.00 mg/dL   Calcium 9.4 8.9 - 45.4 mg/dL   GFR, Estimated >09 >81 mL/min   Anion gap 11 5 - 15  CBC  Result Value Ref Range   WBC 8.5 4.0 - 10.5 K/uL   RBC 4.43 3.87 - 5.11 MIL/uL   Hemoglobin 12.8 12.0 - 15.0 g/dL   HCT 19.1 47.8 - 29.5 %   MCV 86.2 80.0 - 100.0 fL   MCH 28.9 26.0 - 34.0 pg   MCHC 33.5 30.0 - 36.0 g/dL   RDW 62.1 30.8 - 65.7 %   Platelets 316 150 - 400 K/uL   nRBC 0.0 0.0 - 0.2 %  Urinalysis, Routine w reflex microscopic -Urine, Clean Catch  Result Value Ref Range   Color, Urine YELLOW YELLOW   APPearance CLEAR CLEAR   Specific Gravity, Urine >=1.030 1.005 - 1.030   pH 6.0 5.0 - 8.0   Glucose, UA NEGATIVE NEGATIVE mg/dL   Hgb urine dipstick NEGATIVE NEGATIVE   Bilirubin Urine NEGATIVE NEGATIVE   Ketones, ur NEGATIVE NEGATIVE mg/dL   Protein, ur NEGATIVE NEGATIVE mg/dL   Nitrite NEGATIVE NEGATIVE   Leukocytes,Ua NEGATIVE NEGATIVE  Pregnancy, urine  Result Value Ref Range   Preg Test, Ur NEGATIVE NEGATIVE  CBG monitoring, ED  Result Value Ref Range   Glucose-Capillary 115 (H) 70 - 99 mg/dL    EKG EKG Interpretation Date/Time:  Thursday April 05 2023 15:48:56 EDT Ventricular Rate:  88 PR Interval:  141 QRS Duration:  81 QT Interval:  355 QTC Calculation: 430 R Axis:   55  Text Interpretation: Sinus rhythm No significant change since last tracing Confirmed by Melene Plan [phone]) on 04/05/2023 6:17:44 PM  Radiology No results found.  Procedures Procedures  CPT [id number].  I discussed therapeutic maneuver for the patient's BPPV.  The patient consented to the procedure.  The patient went through a series of head maneuvers with transient dizziness and improvement.  Feeling mildly improved on reassessment.    Medications Ordered in  ED Medications  meclizine (ANTIVERT) tablet 50 mg (has no administration in time range)    ED Course/ Medical Decision  Making/ A&P Clinical Course as of 04/05/23 1840  Thu Apr 05, 2023  6295 Patient to ED with ss/sxs as per HPI. Neurologic exam showing nystagmus but otherwise intact.  [SU]  1836 Epley maneuver attempted with mild improvement only. Diagnosis of BPPV made. No evidence CVA, infection.  [SU]    Clinical Course User Index [SU] Elpidio Anis, PA-C                                 Medical Decision Making Amount and/or Complexity of Data Reviewed Labs: ordered.           Final Clinical Impression(s) / ED Diagnoses Final diagnoses:  Vertigo    Rx / DC Orders ED Discharge Orders          Ordered    meclizine (ANTIVERT) 25 MG tablet  3 times daily PRN        04/05/23 1839              Elpidio Anis, PA-C 04/05/23 1840    Melene Plan, DO 04/05/23 1847

## 2023-04-05 NOTE — ED Triage Notes (Signed)
Pt. Reports she has been dizzy for 4 days and today was the worst of all dizzy spells.  Pt. Reports she felt like the world went sideways.  Pt. Reports no syncope.  Pt. Reports no nausea or vomiting.  Pt. Reports she has misjudged walls and other objects at times with this dizziness the last 4 days.

## 2023-04-05 NOTE — Discharge Instructions (Signed)
Take Meclizine as prescribed. Follow up with your doctor if symptoms do not resolve over the next several days. Return to the emergency department with any new or worsening symptoms

## 2023-04-06 ENCOUNTER — Ambulatory Visit: Payer: Medicaid Other | Admitting: Family Medicine

## 2023-04-16 ENCOUNTER — Ambulatory Visit: Payer: Medicaid Other | Admitting: Family Medicine

## 2023-04-16 ENCOUNTER — Ambulatory Visit: Payer: Medicaid Other | Admitting: Student

## 2023-04-16 VITALS — BP 120/78 | HR 89 | Ht 63.0 in | Wt 243.0 lb

## 2023-04-16 DIAGNOSIS — Z124 Encounter for screening for malignant neoplasm of cervix: Secondary | ICD-10-CM | POA: Insufficient documentation

## 2023-04-16 NOTE — Patient Instructions (Addendum)
It was wonderful to see you today!  Please reschedule your cervical cancer screening appointment for next week when you are no longer on your period.   Please call [phone] with any questions about today's appointment.   If you need any additional refills, please call your pharmacy before calling the office.  Gerrit Heck, DO Family Medicine

## 2023-04-16 NOTE — Assessment & Plan Note (Signed)
Unable to complete cervical cancer screening at this time due to onset of patient's menstrual cycle.  Instructed patient to reschedule for approximately 1 week from now.

## 2023-04-16 NOTE — Progress Notes (Deleted)
GYNECOLOGY PROCEDURE NOTE  Jo [name] is a 27 y.o. No obstetric history on file. here for Nexplanon removal***insertion. No other gynecologic concerns. ADD NEXPLANON TO PROBLEM LIST AND REMOVAL DATE IN OVERVIEW***  Nexplanon Insertion Patient identified, informed consent performed, consent signed.   Patient does understand that irregular bleeding is a very common side effect of this medication. She was advised to have backup contraception for one week after placement. Pregnancy test in clinic today was negative.  Appropriate time out taken.  Patient's *** arm was prepped and draped in the usual sterile fashion. The insertion area was measured and marked.  Patient was prepped with alcohol swab and then injected with 3 ml of 1% lidocaine.  The area was then prepped with betadine, Nexplanon removed from packaging,  device confirmed present within needle, then inserted full length of needle and withdrawn per handbook instructions. Nexplanon was able to palpated in the patient's arm; patient palpated the insert herself. There was minimal blood loss.  Patient insertion site covered with guaze and a pressure bandage to reduce any bruising.  The patient tolerated the procedure well and was given post procedure instructions.   Nexplanon Removal Patient identified, informed consent performed, consent signed.   Appropriate time out taken. Nexplanon site identified.  Area prepped in usual sterile fashon. One ml of 1% lidocaine was used to anesthetize the area at the distal end of the implant. A small stab incision was made right beside the implant on the distal portion.  The Nexplanon rod was grasped using hemostats and removed without difficulty.  There was minimal blood loss. There were no complications.  3 ml of 1% lidocaine was injected around the incision for post-procedure analgesia.  Steri-strips were applied over the small incision.  A pressure bandage was applied to reduce any bruising.  The patient  tolerated the procedure well and was given post procedure instructions.  Patient is planning to use *** for contraception/attempt conception.  Nexplanon Removal and Insertion  Patient identified, informed consent performed, consent signed.   Patient does understand that irregular bleeding is a very common side effect of this medication. She was advised to have backup contraception for one week after replacement of the implant. Pregnancy test in clinic today was negative.  Appropriate time out taken. Implanon site identified. Area prepped in usual sterile fashon. One ml of 1% lidocaine was used to anesthetize the area at the distal end of the implant. A small stab incision was made right beside the implant on the distal portion. The Nexplanon rod was grasped using hemostats and removed without difficulty. There was minimal blood loss. There were no complications. Area was then injected with 3 ml of 1 % lidocaine. She was re-prepped with betadine, Nexplanon removed from packaging, Device confirmed in needle, then inserted full length of needle and withdrawn per handbook instructions. Nexplanon was able to palpated in the patient's arm; patient palpated the insert herself.  There was minimal blood loss. Patient insertion site covered with guaze and a pressure bandage to reduce any bruising. The patient tolerated the procedure well and was given post procedure instructions.  She was advised to have backup contraception for one week.    Shelby Mattocks, DO 04/16/2023, 7:50 AM PGY-***, [facility]

## 2023-04-16 NOTE — Progress Notes (Signed)
    SUBJECTIVE:   CHIEF COMPLAINT / HPI:   Here today for routine Pap test.  Declines any STI testing at this time.  No concerns.   PERTINENT  PMH / PSH: none  OBJECTIVE:   BP 120/78   Pulse 89   Ht 5\' 3"  (1.6 m)   Wt 243 lb (110.2 kg)   LMP 04/14/2023   SpO2 98%   BMI 43.05 kg/m   GU: Mal Amabile present as chaperone. Normal external vulvar anatomy.  Vaginal mucosa is pink and moist.  Copious amounts of dark brown blood in the vaginal vault  ASSESSMENT/PLAN:   Cervical cancer screening Unable to complete cervical cancer screening at this time due to onset of patient's menstrual cycle.  Instructed patient to reschedule for approximately 1 week from now.   Gerrit Heck, DO [facility]

## 2023-04-23 ENCOUNTER — Encounter: Payer: Self-pay | Admitting: Family Medicine

## 2023-04-23 ENCOUNTER — Ambulatory Visit: Payer: Medicaid Other | Admitting: Family Medicine

## 2023-04-23 VITALS — BP 121/75 | HR 67 | Ht 63.0 in | Wt 246.0 lb

## 2023-04-23 DIAGNOSIS — Z3046 Encounter for surveillance of implantable subdermal contraceptive: Secondary | ICD-10-CM

## 2023-04-23 NOTE — Progress Notes (Signed)
    SUBJECTIVE:   CHIEF COMPLAINT / HPI:   Pt presented for nexplanon removal in left arm.    OBJECTIVE:   BP 121/75   Pulse 67   Ht 5\' 3"  (1.6 m)   Wt 246 lb (111.6 kg)   LMP 04/19/2023   SpO2 98%   BMI 43.58 kg/m   Well appearing, NAD  ASSESSMENT/PLAN:   No problem-specific Assessment & Plan notes found for this encounter.   Unable to perform nexplanon removal today due to not having the necessary equipment in clinic at this time. Rescheduled pt with myself for 10/23.    Para March, DO [facility]

## 2023-04-23 NOTE — Patient Instructions (Signed)
It was great to see you today! Thank you for choosing [facility] for your primary care. Jo [name] was seen for nexplanon removal.  Today we addressed: Rescheduled your nexplanon removal for 10/23 at 8:45am   Thank you for allowing me to participate in your care, Para March, DO 04/23/2023, 10:50 AM PGY-1, [facility]

## 2023-04-25 ENCOUNTER — Ambulatory Visit: Payer: Medicaid Other | Admitting: Family Medicine

## 2023-05-09 ENCOUNTER — Ambulatory Visit: Payer: Medicaid Other | Admitting: Family Medicine

## 2023-05-09 ENCOUNTER — Encounter: Payer: Self-pay | Admitting: Family Medicine

## 2023-05-09 VITALS — BP 127/83 | HR 70 | Ht 63.0 in | Wt 246.0 lb

## 2023-05-09 DIAGNOSIS — F32A Depression, unspecified: Secondary | ICD-10-CM | POA: Diagnosis not present

## 2023-05-09 DIAGNOSIS — Z308 Encounter for other contraceptive management: Secondary | ICD-10-CM | POA: Diagnosis not present

## 2023-05-09 DIAGNOSIS — F419 Anxiety disorder, unspecified: Secondary | ICD-10-CM

## 2023-05-09 MED ORDER — BUPROPION HCL ER (SR) 150 MG PO TB12: ORAL_TABLET | ORAL | 0 refills | Status: DC

## 2023-05-09 MED ORDER — CITALOPRAM HYDROBROMIDE 10 MG PO TABS: ORAL_TABLET | ORAL | 0 refills | Status: DC

## 2023-05-09 NOTE — Patient Instructions (Addendum)
It was great to see you today! Thank you for choosing [facility] for your primary care. Jo [name] was seen for nexplanon removal.  Today we addressed: Taper your Celexa: Take your normal 10mg  dose every other day for 2 weeks.  Then take 5 mg (cut the pill in half) every other day for 2 weeks.  You can start the Wellbutrin at the same time. Take 1 Wellbutrin pill for the next 3 days, and then start taking it twice a day Please follow-up in 4 to 6 weeks to discuss your new medication. We removed your Nexplanon!  If you decide that you would like  If you haven't already, sign up for My Chart to have easy access to your labs results, and communication with your primary care physician.  Call the clinic at [phone] if your symptoms worsen or you have any concerns.  You should return to our clinic Return in about 4 weeks (around 06/06/2023) for discuss wellbutrin. Please arrive 15 minutes before your appointment to ensure smooth check in process.  We appreciate your efforts in making this happen.  Thank you for allowing me to participate in your care, Para March, DO 05/09/2023, 9:44 AM PGY-1, [facility]

## 2023-05-09 NOTE — Assessment & Plan Note (Addendum)
Nexplanon Removal Patient identified, informed consent performed, consent signed.  Nexplanon site identified.  Area prepped in usual sterile fashon. 5ml of 1% lidocaine was used to anesthetize the area at the distal end of the implant. A small stab incision was made right beside the implant on the distal portion.  The Nexplanon rod was grasped using hemostats and removed without difficulty.  There was minimal blood loss. There were no complications.  Steri-strips were applied over the small incision.  A pressure bandage was applied to reduce any bruising.  The patient tolerated the procedure well and was given post procedure instructions.  Patient is declining contraception at this time.  Nexplanon removal today as above.  No complications.  Patient declines any additional contraception at this time.  Advised to make an appointment if she desires contraception again.

## 2023-05-09 NOTE — Progress Notes (Addendum)
    SUBJECTIVE:   CHIEF COMPLAINT / HPI:   Presents for Nexplanon removal.  Flowsheet Row Office Visit from 05/09/2023 in [facility]  PHQ-9 Total Score 18     PHQ-9 score of 18 with score of 0 question 10.  Denies SI or HI.  States that she feels that she has no energy most of the time, sleeps a lot during the day.  Reports stressors including grad school and relationship.  Takes Celexa 10 mg every day but feels like it is no longer working for her, she has been taking it since February.  Reports that she has taken Wellbutrin in the past and would like to go back on that.  OBJECTIVE:   BP 127/83   Pulse 70   Ht 5\' 3"  (1.6 m)   Wt 246 lb (111.6 kg)   LMP 04/19/2023   SpO2 98%   BMI 43.58 kg/m   Gen: well appearing, no acute distress  ASSESSMENT/PLAN:   Anxiety and depression Currently taking Celexa 10 mg daily for anxiety and depression, states she started this in February but it is no longer working for her. - Will taper Celexa: 10 mg every other day for 2 weeks, then 5 mg every other day for 2 weeks - Will also start Wellbutrin, 150 mg for 3 days and then 150 mg twice daily - Patient will follow-up in 4 weeks to discuss how the Wellbutrin is working for her  Contraception management Nexplanon Removal Patient identified, informed consent performed, consent signed.  Nexplanon site identified.  Area prepped in usual sterile fashon. 5ml of 1% lidocaine was used to anesthetize the area at the distal end of the implant. A small stab incision was made right beside the implant on the distal portion.  The Nexplanon rod was grasped using hemostats and removed without difficulty.  There was minimal blood loss. There were no complications.  Steri-strips were applied over the small incision.  A pressure bandage was applied to reduce any bruising.  The patient tolerated the procedure well and was given post procedure instructions.  Patient is declining contraception at  this time.  Nexplanon removal today as above.  No complications.  Patient declines any additional contraception at this time.  Advised to make an appointment if she desires contraception again.     Para March, DO [facility]

## 2023-05-09 NOTE — Assessment & Plan Note (Signed)
Currently taking Celexa 10 mg daily for anxiety and depression, states she started this in February but it is no longer working for her. - Will taper Celexa: 10 mg every other day for 2 weeks, then 5 mg every other day for 2 weeks - Will also start Wellbutrin, 150 mg for 3 days and then 150 mg twice daily - Patient will follow-up in 4 weeks to discuss how the Wellbutrin is working for her

## 2023-06-01 ENCOUNTER — Other Ambulatory Visit: Payer: Self-pay | Admitting: Family Medicine

## 2023-06-08 ENCOUNTER — Ambulatory Visit (INDEPENDENT_AMBULATORY_CARE_PROVIDER_SITE_OTHER): Payer: Medicaid Other | Admitting: Student

## 2023-06-08 ENCOUNTER — Encounter: Payer: Self-pay | Admitting: Student

## 2023-06-08 VITALS — BP 126/80 | HR 94

## 2023-06-08 DIAGNOSIS — F419 Anxiety disorder, unspecified: Secondary | ICD-10-CM

## 2023-06-08 DIAGNOSIS — F32A Depression, unspecified: Secondary | ICD-10-CM | POA: Diagnosis not present

## 2023-06-08 MED ORDER — BUPROPION HCL ER (SR) 150 MG PO TB12: 150.0000 mg | ORAL_TABLET | Freq: Two times a day (BID) | ORAL | 2 refills | Status: DC

## 2023-06-08 NOTE — Assessment & Plan Note (Signed)
Doing well.  She is currently taking Wellbutrin 300 mg daily instead of 150 mg twice daily.  Given she is having some lower energy towards the end of the day, recommend considering taking this as indicated twice daily however she can measure this herself.  Should she feel necessary when she is through divorce and in a more stable place, consider tapering off at her own discretion but with medical assistance during clinic appointment.

## 2023-06-08 NOTE — Patient Instructions (Signed)
It was great to see you today! Thank you for choosing [facility] for your primary care.  Today we addressed: Depression/anxiety: I am glad you are doing well with the Wellbutrin at your current dosage.  As discussed, if you feel your energy lagging towards the end of the day that may be related to taking all the medication at once.  You may try taking it twice a day rather than both pills at the same time in the morning.  When your current life circumstances improve and you feel yourself in a more stable place, if you would like to consider discontinuing this medication please let us know and we can formulate an individualized plan to taper off this medication.  If you haven't already, sign up for My Chart to have easy access to your labs results, and communication with your primary care physician.  Return if symptoms worsen or fail to improve. Please arrive 15 minutes before your appointment to ensure smooth check in process.  We appreciate your efforts in making this happen.  Thank you for allowing me to participate in your care, Shelby Mattocks, DO 06/08/2023, 9:39 AM PGY-3, [facility]

## 2023-06-08 NOTE — Progress Notes (Signed)
  SUBJECTIVE:   CHIEF COMPLAINT / HPI:   Anxiety/depression: Last seen for this on 05/09/2023.  Recently tapered off Celexa and onto Wellbutrin.  Currently taking Wellbutrin 150 mg twice daily.  PHQ-9 score of 3, negative question 9.  This medication adjustment was in the setting of going through divorce.  PERTINENT  PMH / PSH: Anxiety/depression  OBJECTIVE:  BP 126/80   Pulse 94   SpO2 98%  General: Well-appearing, NAD CV: RRR, no murmurs auscultated Psych: Normal mood and affect  ASSESSMENT/PLAN:   Assessment & Plan Anxiety and depression Doing well.  She is currently taking Wellbutrin 300 mg daily instead of 150 mg twice daily.  Given she is having some lower energy towards the end of the day, recommend considering taking this as indicated twice daily however she can measure this herself.  Should she feel necessary when she is through divorce and in a more stable place, consider tapering off at her own discretion but with medical assistance during clinic appointment. Return if symptoms worsen or fail to improve. Shelby Mattocks, DO 06/08/2023, 10:08 AM PGY-3, [facility]

## 2023-06-20 ENCOUNTER — Emergency Department (HOSPITAL_BASED_OUTPATIENT_CLINIC_OR_DEPARTMENT_OTHER)
Admission: EM | Admit: 2023-06-20 | Discharge: 2023-06-20 | Disposition: A | Discharge status: Home / Self Care | Payer: Medicaid Other | Attending: Emergency Medicine | Admitting: Emergency Medicine

## 2023-06-20 ENCOUNTER — Encounter (HOSPITAL_BASED_OUTPATIENT_CLINIC_OR_DEPARTMENT_OTHER): Payer: Self-pay

## 2023-06-20 ENCOUNTER — Other Ambulatory Visit: Payer: Self-pay

## 2023-06-20 DIAGNOSIS — S76111A Strain of right quadriceps muscle, fascia and tendon, initial encounter: Secondary | ICD-10-CM | POA: Insufficient documentation

## 2023-06-20 DIAGNOSIS — G8911 Acute pain due to trauma: Secondary | ICD-10-CM | POA: Insufficient documentation

## 2023-06-20 DIAGNOSIS — Y92410 Unspecified street and highway as the place of occurrence of the external cause: Secondary | ICD-10-CM | POA: Diagnosis not present

## 2023-06-20 DIAGNOSIS — S29012A Strain of muscle and tendon of back wall of thorax, initial encounter: Secondary | ICD-10-CM | POA: Diagnosis not present

## 2023-06-20 DIAGNOSIS — S46911A Strain of unspecified muscle, fascia and tendon at shoulder and upper arm level, right arm, initial encounter: Secondary | ICD-10-CM | POA: Diagnosis not present

## 2023-06-20 DIAGNOSIS — M25511 Pain in right shoulder: Secondary | ICD-10-CM | POA: Diagnosis not present

## 2023-06-20 DIAGNOSIS — S29019A Strain of muscle and tendon of unspecified wall of thorax, initial encounter: Secondary | ICD-10-CM | POA: Insufficient documentation

## 2023-06-20 DIAGNOSIS — M546 Pain in thoracic spine: Secondary | ICD-10-CM | POA: Diagnosis present

## 2023-06-20 MED ORDER — CYCLOBENZAPRINE HCL 10 MG PO TABS
10.0000 mg | ORAL_TABLET | Freq: Once | ORAL | Status: AC
  Administered 2023-06-20: 10 mg via ORAL
  Filled 2023-06-20: qty 1

## 2023-06-20 MED ORDER — CYCLOBENZAPRINE HCL 10 MG PO TABS: 10.0000 mg | ORAL_TABLET | Freq: Two times a day (BID) | ORAL | 0 refills | Status: DC | PRN

## 2023-06-20 MED ORDER — ACETAMINOPHEN 500 MG PO TABS
1000.0000 mg | ORAL_TABLET | Freq: Once | ORAL | Status: AC
  Administered 2023-06-20: 1000 mg via ORAL
  Filled 2023-06-20: qty 2

## 2023-06-20 MED ORDER — IBUPROFEN 400 MG PO TABS
600.0000 mg | ORAL_TABLET | Freq: Once | ORAL | Status: AC
  Administered 2023-06-20: 600 mg via ORAL
  Filled 2023-06-20: qty 1

## 2023-06-20 MED ORDER — LIDOCAINE 5 % EX PTCH: 1.0000 | MEDICATED_PATCH | CUTANEOUS | 0 refills | Status: DC

## 2023-06-20 MED ORDER — LIDOCAINE 5 % EX PTCH
1.0000 | MEDICATED_PATCH | Freq: Once | CUTANEOUS | Status: DC
  Administered 2023-06-20: 1 via TRANSDERMAL
  Filled 2023-06-20: qty 1
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
## 2023-07-12 ENCOUNTER — Other Ambulatory Visit: Payer: Self-pay | Admitting: Student

## 2023-07-12 DIAGNOSIS — F419 Anxiety disorder, unspecified: Secondary | ICD-10-CM

## 2023-08-22 NOTE — Progress Notes (Signed)
  SUBJECTIVE:   CHIEF COMPLAINT / HPI:   Presents today with missed period. Since getting birth control removed, she has had monthly menstrual period. LMP 07/14/23. Sexually active. Denies bleeding, vaginal discharge, cramping, abdominal pain. She is not necessarily trying to get pregnant but open to it. Single sexual partner. Denies drug use, alcohol use, smoking. She is not taking a prenatal vitamin but does take a multivitamin. She does not have any history of being pregnant. Prior to having nexplanon , her periods were generally monthly. Her periods generally last 5 days with moderate flow.   PERTINENT  PMH / PSH: Anxiety/depression  OBJECTIVE:  BP 125/87   Pulse 91   Wt 245 lb 6.4 oz (111.3 kg)   SpO2 98%   BMI 43.47 kg/m  General: Well-appearing, NAD CV: RRR, murmurs auscultated Pulm: CTAB, normal WOB Abdomen: Soft, nontender, normoactive bowel sounds  ASSESSMENT/PLAN:   Assessment & Plan Missed menses Negative urine pregnancy.  Given she is sexually active without contraceptive use, recommend prenatal vitamin daily.  Advised recheck pregnancy test in 2 weeks.  Educated that secondary menorrhea workup begins when there is absence of menses for more than 3 months (given she has had previously regular menstrual cycles).  Advised to return should she reach that point.  Educated that there may be hormonal changes with weight fluctuations.  Should she return, recommend FSH, prolactin, TSH. Return if symptoms worsen or fail to improve. Kieth Johnson, DO 08/23/2023, 2:53 PM PGY-3, [facility]

## 2023-08-23 ENCOUNTER — Encounter: Payer: Self-pay | Admitting: Student

## 2023-08-23 ENCOUNTER — Ambulatory Visit: Payer: Medicaid Other | Admitting: Student

## 2023-08-23 VITALS — BP 125/87 | HR 91 | Wt 245.4 lb

## 2023-08-23 DIAGNOSIS — N926 Irregular menstruation, unspecified: Secondary | ICD-10-CM

## 2023-08-23 DIAGNOSIS — Z23 Encounter for immunization: Secondary | ICD-10-CM

## 2023-08-23 LAB — POCT URINE PREGNANCY: Preg Test, Ur: NEGATIVE

## 2023-08-23 NOTE — Patient Instructions (Signed)
 It was great to see you today! Thank you for choosing [facility] for your primary care.  Today we addressed: Your pregnancy test is negative.  In the setting of missed menses, workup is recommended when you reach the 63-month mark.  I do recommend you repeat a pregnancy test in 2 weeks however if you continue to not have a period after 3 months, please return to see me for further workup.  I recommend that you take a prenatal daily and avoid any alcohol use as you are sexually active.  If you haven't already, sign up for My Chart to have easy access to your labs results, and communication with your primary care physician.  Return if symptoms worsen or fail to improve. Please arrive 15 minutes before your appointment to ensure smooth check in process.  We appreciate your efforts in making this happen.  Thank you for allowing me to participate in your care, Kieth Johnson, DO 08/23/2023, 2:36 PM PGY-3, [facility]

## 2023-12-04 ENCOUNTER — Encounter: Payer: Self-pay | Admitting: Student

## 2023-12-04 ENCOUNTER — Ambulatory Visit (INDEPENDENT_AMBULATORY_CARE_PROVIDER_SITE_OTHER): Admitting: Student

## 2023-12-04 VITALS — BP 126/82 | HR 86 | Ht 63.0 in | Wt 236.4 lb

## 2023-12-04 DIAGNOSIS — R739 Hyperglycemia, unspecified: Secondary | ICD-10-CM | POA: Diagnosis not present

## 2023-12-04 DIAGNOSIS — L680 Hirsutism: Secondary | ICD-10-CM

## 2023-12-04 DIAGNOSIS — F32A Depression, unspecified: Secondary | ICD-10-CM | POA: Diagnosis not present

## 2023-12-04 DIAGNOSIS — F419 Anxiety disorder, unspecified: Secondary | ICD-10-CM

## 2023-12-04 LAB — POCT GLYCOSYLATED HEMOGLOBIN (HGB A1C): Hemoglobin A1C: 5.5 % (ref 4.0–5.6)

## 2023-12-04 MED ORDER — BUPROPION HCL ER (SR) 150 MG PO TB12: 150.0000 mg | ORAL_TABLET | Freq: Two times a day (BID) | ORAL | 1 refills | Status: DC

## 2023-12-04 NOTE — Progress Notes (Signed)
  SUBJECTIVE:   CHIEF COMPLAINT / HPI:   Jo [name] is a 28 year old female who presents with concerns about blood sugar levels and hormone-related symptoms.  Her blood sugar levels have fluctuated, with a reading of 125 mg/dL in September and 68 mg/dL at a community event last week. She experiences fatigue and appetite changes, particularly around her menstrual cycle. While on bupropion , her appetite decreases, but she experiences increased hunger during her cycle. She requires a prescription refill for bupropion .  Since the removal of her Nexplanon  in October, she has experienced irregular menstrual cycles. Her last period was from May 15th to May 18th, with a previous period on March 17th and a missed period in April. Before Nexplanon , her periods were regular.  She has developed facial hair under her chin as well which is concerned her.  PERTINENT  PMH / PSH: N/A  OBJECTIVE:  BP 126/82   Pulse 86   Ht 5\' 3"  (1.6 m)   Wt 236 lb 6.4 oz (107.2 kg)   LMP 11/29/2023   SpO2 98%   BMI 41.88 kg/m  General: Well-appearing, NAD Skin: Coarse hair on the chin and neck bilaterally consistent with hirsutism  ASSESSMENT/PLAN:   Assessment & Plan Hirsutism Given history of missed period and hirsutism, consider diagnosis of PCOS.  Her missed periods are not consistent enough to meet classification criteria.  Obtain transvaginal ultrasound to further investigate.  I have discussed her other labs which have been normal, I would not recommend hormone testing at this time. Hyperglycemia A1c 5.5, no diabetes or prediabetes. Anxiety and depression Refill bupropion  Return if symptoms worsen or fail to improve. Veronia Goon, DO 12/04/2023, 6:11 PM PGY-3, [facility]

## 2023-12-04 NOTE — Assessment & Plan Note (Signed)
Refill- bupropion °

## 2023-12-04 NOTE — Patient Instructions (Addendum)
 It was great to see you today! Thank you for choosing [facility] for your primary care.  Today we addressed: Lets get an ultrasound to evaluate for PCOS.  Your A1c was completely normal.  If you haven't already, sign up for My Chart to have easy access to your labs results, and communication with your primary care physician.  Return if symptoms worsen or fail to improve. Please arrive 15 minutes before your appointment to ensure smooth check in process.  We appreciate your efforts in making this happen.  Thank you for allowing me to participate in your care, Veronia Goon, DO 12/04/2023, 4:36 PM PGY-3, [facility]

## 2023-12-11 ENCOUNTER — Ambulatory Visit
Admission: RE | Admit: 2023-12-11 | Discharge: 2023-12-11 | Disposition: A | Discharge status: Home / Self Care | Payer: PRIVATE HEALTH INSURANCE | Source: Ambulatory Visit | Attending: Family Medicine | Admitting: Family Medicine

## 2023-12-11 DIAGNOSIS — L680 Hirsutism: Secondary | ICD-10-CM

## 2023-12-11 DIAGNOSIS — N8301 Follicular cyst of right ovary: Secondary | ICD-10-CM | POA: Diagnosis not present

## 2023-12-25 ENCOUNTER — Ambulatory Visit: Payer: Self-pay | Admitting: Student

## 2023-12-25 ENCOUNTER — Encounter: Payer: Self-pay | Admitting: *Deleted

## 2024-01-07 NOTE — Progress Notes (Unsigned)
  SUBJECTIVE:   CHIEF COMPLAINT / HPI:   Presents today to discuss ultrasound results in combination with hirsutism. She meets 2/3 cr  PERTINENT  PMH / PSH: ***  OBJECTIVE:  There were no vitals taken for this visit. ***  ASSESSMENT/PLAN:   Assessment & Plan  No follow-ups on file. Kieth Johnson, DO 01/07/2024, 5:25 PM PGY-***, [facility] {    This will disappear when note is signed, click to select method of visit    :1}

## 2024-01-08 ENCOUNTER — Ambulatory Visit: Payer: PRIVATE HEALTH INSURANCE | Admitting: Student

## 2024-01-08 ENCOUNTER — Encounter: Payer: Self-pay | Admitting: Student

## 2024-01-08 VITALS — BP 129/85 | HR 78 | Ht 63.0 in | Wt 235.0 lb

## 2024-01-08 DIAGNOSIS — E282 Polycystic ovarian syndrome: Secondary | ICD-10-CM | POA: Diagnosis not present

## 2024-01-08 LAB — POCT URINE PREGNANCY: Preg Test, Ur: NEGATIVE

## 2024-01-08 MED ORDER — SPIRONOLACTONE 50 MG PO TABS: 50.0000 mg | ORAL_TABLET | Freq: Every day | ORAL | 0 refills | Status: DC

## 2024-01-08 MED ORDER — NORETHINDRONE 0.35 MG PO TABS: 1.0000 | ORAL_TABLET | Freq: Every day | ORAL | 11 refills | Status: AC

## 2024-01-08 NOTE — Assessment & Plan Note (Signed)
 Educated on PCOS, reviewed imaging findings, all questions answered.  Hirsutism/hyperandrogenism and follicular findings consistent with PCOS on ultrasound.  She does not need menstrual regulation however given initiation of spironolactone for hirsutism, initiate progestin only contraceptive.  Estrogen containing medications are contraindicated for her given her history of migraines with aura.  Consider metformin in the future as well.  Follow-up BMP in 1 week, PCOS follow-up in 2 months.

## 2024-01-08 NOTE — Patient Instructions (Signed)
 It was great to see you today! Thank you for choosing [facility] for your primary care.  Today we addressed: You have PCOS, start progestin minipill daily, spironolactone 50 mg daily.  I have also attached dietary recommendations for those with PCOS.  If you haven't already, sign up for My Chart to have easy access to your labs results, and communication with your primary care physician.  Return in about 2 months (around 03/09/2024) for PCOS follow-up. Please arrive 15 minutes before your appointment to ensure smooth check in process.  We appreciate your efforts in making this happen.  Thank you for allowing me to participate in your care, Kieth Johnson, DO 01/08/2024, 2:08 PM PGY-3, [facility]

## 2024-01-09 ENCOUNTER — Telehealth: Payer: Self-pay | Admitting: Student

## 2024-01-09 NOTE — Telephone Encounter (Signed)
-----   Message from Kieth Johnson sent at 01/08/2024  2:23 PM EDT ----- Regarding: Lab appointment Please call patient to schedule BMP check on approximately 01/15/2024.  She should be aware and I have already ordered future lab.

## 2024-01-09 NOTE — Telephone Encounter (Signed)
 Called patient Jo [name] to call back.   Thanks!

## 2024-01-16 ENCOUNTER — Other Ambulatory Visit: Payer: PRIVATE HEALTH INSURANCE

## 2024-01-16 DIAGNOSIS — E282 Polycystic ovarian syndrome: Secondary | ICD-10-CM

## 2024-01-17 LAB — BASIC METABOLIC PANEL WITH GFR
BUN/Creatinine Ratio: 11 (ref 9–23)
BUN: 9 mg/dL (ref 6–20)
CO2: 20 mmol/L (ref 20–29)
Calcium: 9.3 mg/dL (ref 8.7–10.2)
Chloride: 103 mmol/L (ref 96–106)
Creatinine, Ser: 0.81 mg/dL (ref 0.57–1.00)
Glucose: 88 mg/dL (ref 70–99)
Potassium: 4.7 mmol/L (ref 3.5–5.2)
Sodium: 139 mmol/L (ref 134–144)
eGFR: 101 mL/min/{1.73_m2} (ref >59)

## 2024-04-08 ENCOUNTER — Other Ambulatory Visit: Payer: Self-pay

## 2024-04-08 DIAGNOSIS — E282 Polycystic ovarian syndrome: Secondary | ICD-10-CM

## 2024-04-09 ENCOUNTER — Telehealth: Payer: Self-pay

## 2024-04-09 MED ORDER — SPIRONOLACTONE 50 MG PO TABS: 50.0000 mg | ORAL_TABLET | Freq: Every day | ORAL | 0 refills | Status: DC

## 2024-04-09 NOTE — Telephone Encounter (Signed)
 Called patient per Dr. Osker request to schedule for a PCOS follow up. Was able to schedule patient for 04/14/24 @ 10:15am  Harlene Reiter, CMA

## 2024-04-14 ENCOUNTER — Ambulatory Visit: Payer: PRIVATE HEALTH INSURANCE

## 2024-04-14 VITALS — BP 118/73 | HR 79 | Ht 63.0 in | Wt 229.5 lb

## 2024-04-14 DIAGNOSIS — E282 Polycystic ovarian syndrome: Secondary | ICD-10-CM | POA: Diagnosis not present

## 2024-04-14 MED ORDER — METFORMIN HCL ER 500 MG PO TB24: 500.0000 mg | ORAL_TABLET | Freq: Every day | ORAL | 1 refills | Status: DC

## 2024-04-14 NOTE — Progress Notes (Signed)
    SUBJECTIVE:   CHIEF COMPLAINT / HPI: PCOS follow-up, weight management  PCOS Her primary symptoms of PCOS or menstrual cycles only every 2 to 3 months and hirsutism primarily hair on her chin.  Since starting the spironolactone , her hair growth has resolved.  No other symptoms of hirsutism.   Weight loss She has been working on weight loss 2 miles walking per day Lost 20 pounds in the last year Does eat a good amount of sugary drinks, but is cutting this back  Family-planning Goal 2-3 kids, not for at least a year, ultimately she would like to wait to become pregnant after she gets married which she is planning for 2027. She takes birth control every day and uses barrier contraception   PERTINENT  PMH / PSH: Depression, PCOS, morbid obesity  OBJECTIVE:   BP 118/73   Pulse 79   Ht 5' 3 (1.6 m)   Wt 229 lb 8 oz (104.1 kg)   LMP 04/02/2024   SpO2 98%   BMI 40.65 kg/m    General: Well-appearing female in no acute distress CV: Regular rate Pulm: Breathing well on room air  ASSESSMENT/PLAN:   Assessment & Plan PCOS (polycystic ovarian syndrome) She is tolerating spironolactone  well with improvement of her hirsutism We extensively discussed the importance of stopping these medications before trying to achieve pregnancy.  She is consistent with her birth control and also uses barrier contraceptive. Adding metformin 500 mg once a day Morbid obesity (HCC) She is making progress with weight loss.  20 pounds in the last year. See AVS for calorie recommendations and weight loss goal She could likely benefit from increase daily exercise, but does not feel this is realistic at this time Added metformin once a day for PCOS, this may benefit her weight loss as well  She will follow-up in November for more detailed nutrition evaluation and assessment of her weight loss progress and calorie counting     Darral Rishel Alena Morrison, MD [facility]

## 2024-04-14 NOTE — Assessment & Plan Note (Addendum)
 She is tolerating spironolactone  well with improvement of her hirsutism We extensively discussed the importance of stopping these medications before trying to achieve pregnancy.  She is consistent with her birth control and also uses barrier contraceptive. Adding metformin 500 mg once a day

## 2024-04-14 NOTE — Patient Instructions (Addendum)
 Today we discussed weight loss. For weight goal 170 lbs over the next 30 weeks without changing activity: Goal 1,419 Calories/day.  I recommend using an Online calorie counting apps: myfitnesspal, there are others if you want to look into them Bring back a log of calories and regular foods eaten to your next visit so that we can do a nutrition evaluation.  Make sure you log  drinks each day because those can also be a big contributor to sugary and calorie intake Great job with your exercise and weight loss in the last year!  For your PCOS and also to help with some weight management will start metformin 500 mg once a day Continue using your birth control and spironolactone  as you have previously been prescribed.  I look forward to seeing you in November.  Good luck with your move! Elio Art, MD

## 2024-04-16 ENCOUNTER — Other Ambulatory Visit: Payer: Self-pay | Admitting: Family Medicine

## 2024-05-14 ENCOUNTER — Other Ambulatory Visit: Payer: Self-pay

## 2024-05-14 DIAGNOSIS — E282 Polycystic ovarian syndrome: Secondary | ICD-10-CM

## 2024-05-26 ENCOUNTER — Ambulatory Visit: Payer: Self-pay

## 2024-06-05 ENCOUNTER — Ambulatory Visit (INDEPENDENT_AMBULATORY_CARE_PROVIDER_SITE_OTHER): Payer: PRIVATE HEALTH INSURANCE

## 2024-06-05 VITALS — BP 131/82 | HR 97 | Ht 63.0 in | Wt 242.0 lb

## 2024-06-05 DIAGNOSIS — F32A Depression, unspecified: Secondary | ICD-10-CM | POA: Diagnosis not present

## 2024-06-05 DIAGNOSIS — F419 Anxiety disorder, unspecified: Secondary | ICD-10-CM

## 2024-06-05 DIAGNOSIS — E282 Polycystic ovarian syndrome: Secondary | ICD-10-CM

## 2024-06-05 NOTE — Patient Instructions (Addendum)
 If your potassium is normal, we can increase your spironolactone  to 50 mg BID to help with the hair growth.   Goal: walking 30 minutes 3 times a week  I am always happy to see you sooner than January!  Best, Dr. Alena

## 2024-06-05 NOTE — Progress Notes (Signed)
    SUBJECTIVE:   CHIEF COMPLAINT / HPI:   PCOS She notes she has missed a couple doses of all of her medications as she has had a very hectic last month between moving and travel and work.  In this time she has noticed some worsening of the hair growth on her chin and neck. She has been tolerating the metformin  without difficulty.  No diarrhea.  2 transient episodes of nausea in the past month.  Currently on her menstrual cycle, started yesterday, 11/19.  Last LMP 10/18, periods always between 17th-20th each month.  She has missed a couple doses of her birth control as above.  No abdominal or pelvic pain. She is sexually active.  Mood She notes that she has been down for the past month.  Is seeing a counselor once every 2 weeks.  Taking her Wellbutrin  as scheduled.  No thoughts of self-harm.    PERTINENT  PMH / PSH: Depression, PCOS  OBJECTIVE:   BP 131/82   Pulse 97   Ht 5' 3 (1.6 m)   Wt 242 lb (109.8 kg)   SpO2 99%   BMI 42.87 kg/m    General: Well-appearing female in no acute distress Psych: Patient is cheerful, but intermittently nearing tears on exam as she discusses her sadness in the last month.  Good insight.  Speech is regular rate.  Thoughts are goal-directed.  No SI. HEENT: Multiple dark hairs scattered across her chin.  Moist mucous membranes  ASSESSMENT/PLAN:   Assessment & Plan PCOS (polycystic ovarian syndrome) Will check BMP today.  If potassium is within normal limits, we will increase her spironolactone  to 50 mg twice daily for better control of her sick to his him.  Continue on metformin  500 mg daily.  Given her transient nausea, she questioned if she could be pregnant.  Offered pregnancy test in office today, which patient declined.  She will take a pregnancy test at home.  Discussed that since she is likely currently on her menstrual period, it is less likely that she is pregnant; however, she is sexually active and has missed a couple doses of birth  control.  Discussed possibility of implantation bleeding, though it would be an abnormal timeline.  Discussed return precautions.  If her home pregnancy test is positive she will stop taking her spironolactone  and call back for follow-up. Anxiety and depression PHQ-9 10. shared decision making for treatment.  She does not want to make any medication just at this time.  Will continue in therapy.  We set an exercise goal of 30 minutes 3 times a week to help with mood, weight management, and positive self-image.  Follow-up in January for management of her PCOS and depression.   Nycole Kawahara Alena Morrison, MD [facility]

## 2024-06-05 NOTE — Assessment & Plan Note (Signed)
 Will check BMP today.  If potassium is within normal limits, we will increase her spironolactone  to 50 mg twice daily for better control of her sick to his him.  Continue on metformin  500 mg daily.  Given her transient nausea, she questioned if she could be pregnant.  Offered pregnancy test in office today, which patient declined.  She will take a pregnancy test at home.  Discussed that since she is likely currently on her menstrual period, it is less likely that she is pregnant; however, she is sexually active and has missed a couple doses of birth control.  Discussed possibility of implantation bleeding, though it would be an abnormal timeline.  Discussed return precautions.  If her home pregnancy test is positive she will stop taking her spironolactone  and call back for follow-up.

## 2024-06-05 NOTE — Assessment & Plan Note (Signed)
 PHQ-9 10. shared decision making for treatment.  She does not want to make any medication just at this time.  Will continue in therapy.  We set an exercise goal of 30 minutes 3 times a week to help with mood, weight management, and positive self-image.

## 2024-06-06 LAB — BASIC METABOLIC PANEL WITH GFR
BUN/Creatinine Ratio: 18 (ref 9–23)
BUN: 11 mg/dL (ref 6–20)
CO2: 24 mmol/L (ref 20–29)
Calcium: 9.7 mg/dL (ref 8.7–10.2)
Chloride: 102 mmol/L (ref 96–106)
Creatinine, Ser: 0.62 mg/dL (ref 0.57–1.00)
Glucose: 106 mg/dL — ABNORMAL HIGH (ref 70–99)
Potassium: 4.2 mmol/L (ref 3.5–5.2)
Sodium: 140 mmol/L (ref 134–144)
eGFR: 124 mL/min/1.73 (ref >59)

## 2024-06-07 ENCOUNTER — Ambulatory Visit: Payer: Self-pay

## 2024-07-28 ENCOUNTER — Ambulatory Visit: Payer: PRIVATE HEALTH INSURANCE

## 2024-07-28 VITALS — BP 119/83 | HR 77 | Ht 63.0 in | Wt 245.8 lb

## 2024-07-28 DIAGNOSIS — F419 Anxiety disorder, unspecified: Secondary | ICD-10-CM

## 2024-07-28 DIAGNOSIS — R0683 Snoring: Secondary | ICD-10-CM | POA: Diagnosis not present

## 2024-07-28 DIAGNOSIS — E282 Polycystic ovarian syndrome: Secondary | ICD-10-CM | POA: Diagnosis not present

## 2024-07-28 DIAGNOSIS — I10 Essential (primary) hypertension: Secondary | ICD-10-CM | POA: Diagnosis not present

## 2024-07-28 DIAGNOSIS — F32A Depression, unspecified: Secondary | ICD-10-CM | POA: Diagnosis not present

## 2024-07-28 DIAGNOSIS — R5383 Other fatigue: Secondary | ICD-10-CM

## 2024-07-28 MED ORDER — NIFEDIPINE ER OSMOTIC RELEASE 30 MG PO TB24: 30.0000 mg | ORAL_TABLET | Freq: Every day | ORAL | 2 refills | Status: DC

## 2024-07-28 MED ORDER — BUPROPION HCL ER (SR) 150 MG PO TB12: 150.0000 mg | ORAL_TABLET | Freq: Two times a day (BID) | ORAL | 1 refills | Status: AC

## 2024-07-28 MED ORDER — SPIRONOLACTONE 50 MG PO TABS: 50.0000 mg | ORAL_TABLET | Freq: Two times a day (BID) | ORAL | 3 refills | Status: AC

## 2024-07-28 MED ORDER — METFORMIN HCL ER 500 MG PO TB24: 500.0000 mg | ORAL_TABLET | Freq: Every day | ORAL | 1 refills | Status: AC

## 2024-07-28 NOTE — Assessment & Plan Note (Signed)
 STOP BANG 5 with chronic fatigue 2023 TSH and more recent CBC normal, despite same symptoms at that time.  Sleep study to assess for OSA. Consider repeating TSH and CBC if negative and symptoms persistent/worsening

## 2024-07-28 NOTE — Assessment & Plan Note (Addendum)
 Increased spironolactone  to 50 mg BID. BMP ordered for one week after spironolactone  dose increase to monitor K Continue current medication regimen: metformin  500 mg daily, micronor  daily Last A1c 12/04/23 5.5. Plan to repeat A1c in Spring 2026

## 2024-07-28 NOTE — Patient Instructions (Addendum)
 Increase your spironolactone  to 50 mg BID. Come back in a week to get a lab draw to make sure your potassium is still normal.   We will follow up in one month to check your blood pressure.   I ordered a sleep study. If you do not hear from scheduling in two weeks, call our office to follow up.   - Try the following to help you sleep better:  - limit naps during the day  - no screens (TV, phone, tablet, computer) at least 1-2 hours before bedtime.  - have a quiet and dark sleeping environment.  - no large meals or drinks about 1 hour before bed.  - Avoid caffeine after 3pm.  - Exercise or move your body regularly every day.  - You can also try melatonin 5 mg over the counter. Take this 1-2 hours before bed. - If you are lying in bed for 30 mins-1 hour and aren't falling asleep, get out of bed and do something relaxing like reading (NO TV!) until you are tired.  Great job with your exercise. I referred you to the [facility] PREP program for additional assistance.

## 2024-07-28 NOTE — Progress Notes (Signed)
" ° ° °  SUBJECTIVE:   CHIEF COMPLAINT / HPI:   Did not increase her spironolactone  to BID after last visit. Has been out of her metformin  and spiro for the last two weeks. Has been taking her birth control.   Notes chronic fatigue for years. Does snore. Sleeps a lot at night (~11 hours) and naps regularly during the day.   PERTINENT  PMH / PSH: PCOS, depression  OBJECTIVE:   BP 119/83   Pulse 77   Ht 5' 3 (1.6 m)   Wt 245 lb 12.8 oz (111.5 kg)   LMP 07/05/2024   SpO2 97%   BMI 43.54 kg/m    General: well appearing female in NAD, intermittently tearful CV: RRR, no murmurs Pulm: CTAB Neck: frequent hyperpigmented follicles, no thyromegaly, no lymphadenopathy, circumference 40.5 cm  ASSESSMENT/PLAN:   Assessment & Plan PCOS (polycystic ovarian syndrome) Increased spironolactone  to 50 mg BID. BMP ordered for one week after spironolactone  dose increase to monitor K Continue current medication regimen: metformin  500 mg daily, micronor  daily Last A1c 12/04/23 5.5. Plan to repeat A1c in Spring 2026 Anxiety and depression PHQ9: 10, primarily symptoms of fatigue, energy concerns. Occasional feeling down but less bothersome Exercising more regularly now Continue wellbutrin  150 mg BID.  Hypertension, unspecified type Multiple blood pressures 131-132/80-91 over last several years. BP 119/83 today. Discussed goal <130/80.  Initially ordered nifedipine  30 mg daily, but discontinued and advised patient not to fill at pharmacy, as we will increase her spironolactone  to BID for optimization of PCOS as well.  Follow up in one month for BP recheck.  Morbid obesity (HCC) Snores Other fatigue STOP BANG 5 with chronic fatigue 2023 TSH and more recent CBC normal, despite same symptoms at that time.  Sleep study to assess for OSA. Consider repeating TSH and CBC if negative and symptoms persistent/worsening     Jo [name] Jo Morrison, MD [facility] "

## 2024-07-28 NOTE — Assessment & Plan Note (Addendum)
 PHQ9: 10, primarily symptoms of fatigue, energy concerns. Occasional feeling down but less bothersome Exercising more regularly now Continue wellbutrin  150 mg BID.

## 2024-07-31 ENCOUNTER — Telehealth: Payer: Self-pay

## 2024-07-31 NOTE — Telephone Encounter (Signed)
 Called re: PREP program, she has just moved to [place] so it would be a 30 minute drive there and back; works M-F but wants to talk with her supervisor to arrange coming, most likely to [place]; she will call me back and let me know if she is able to work out with her schedule.

## 2024-08-15 ENCOUNTER — Other Ambulatory Visit: Payer: Self-pay

## 2024-08-29 ENCOUNTER — Ambulatory Visit: Payer: Self-pay | Admitting: Family Medicine
